# Patient Record
Sex: Male | Born: 1960 | Race: White | Smoking: Former smoker
Health system: Southern US, Community
[De-identification: ages and names within clinical notes are randomized; demographics above are authoritative.]

## PROBLEM LIST (undated history)

## (undated) DIAGNOSIS — M51369 Other intervertebral disc degeneration, lumbar region without mention of lumbar back pain or lower extremity pain: Secondary | ICD-10-CM

## (undated) DIAGNOSIS — R569 Unspecified convulsions: Secondary | ICD-10-CM

## (undated) DIAGNOSIS — F32A Depression, unspecified: Secondary | ICD-10-CM

## (undated) DIAGNOSIS — M5416 Radiculopathy, lumbar region: Secondary | ICD-10-CM

## (undated) DIAGNOSIS — I208 Other forms of angina pectoris: Secondary | ICD-10-CM

## (undated) DIAGNOSIS — I6381 Other cerebral infarction due to occlusion or stenosis of small artery: Secondary | ICD-10-CM

## (undated) DIAGNOSIS — M419 Scoliosis, unspecified: Secondary | ICD-10-CM

## (undated) DIAGNOSIS — N4400 Torsion of testis, unspecified: Secondary | ICD-10-CM

## (undated) DIAGNOSIS — I10 Essential (primary) hypertension: Secondary | ICD-10-CM

## (undated) DIAGNOSIS — F1290 Cannabis use, unspecified, uncomplicated: Secondary | ICD-10-CM

## (undated) DIAGNOSIS — K5790 Diverticulosis of intestine, part unspecified, without perforation or abscess without bleeding: Secondary | ICD-10-CM

## (undated) DIAGNOSIS — N200 Calculus of kidney: Secondary | ICD-10-CM

## (undated) DIAGNOSIS — E785 Hyperlipidemia, unspecified: Secondary | ICD-10-CM

## (undated) DIAGNOSIS — I2089 Other forms of angina pectoris: Secondary | ICD-10-CM

## (undated) DIAGNOSIS — F419 Anxiety disorder, unspecified: Secondary | ICD-10-CM

## (undated) DIAGNOSIS — L301 Dyshidrosis [pompholyx]: Secondary | ICD-10-CM

## (undated) DIAGNOSIS — I739 Peripheral vascular disease, unspecified: Secondary | ICD-10-CM

## (undated) DIAGNOSIS — R0602 Shortness of breath: Secondary | ICD-10-CM

## (undated) DIAGNOSIS — M5136 Other intervertebral disc degeneration, lumbar region: Secondary | ICD-10-CM

## (undated) DIAGNOSIS — Z7901 Long term (current) use of anticoagulants: Secondary | ICD-10-CM

## (undated) HISTORY — PX: TESTICLE TORSION REDUCTION: SHX795

---

## 2019-08-02 ENCOUNTER — Other Ambulatory Visit: Payer: Self-pay

## 2019-08-02 DIAGNOSIS — Z20822 Contact with and (suspected) exposure to covid-19: Secondary | ICD-10-CM

## 2019-08-04 LAB — NOVEL CORONAVIRUS, NAA: SARS-CoV-2, NAA: NOT DETECTED

## 2020-05-01 ENCOUNTER — Other Ambulatory Visit: Payer: Self-pay | Admitting: Neurology

## 2020-05-01 DIAGNOSIS — R569 Unspecified convulsions: Secondary | ICD-10-CM

## 2020-05-18 ENCOUNTER — Other Ambulatory Visit: Payer: Self-pay

## 2020-05-18 ENCOUNTER — Ambulatory Visit
Admission: RE | Admit: 2020-05-18 | Discharge: 2020-05-18 | Disposition: A | Discharge status: Home / Self Care | Payer: BC Managed Care – PPO | Source: Ambulatory Visit | Attending: Neurology | Admitting: Neurology

## 2020-05-18 DIAGNOSIS — R569 Unspecified convulsions: Secondary | ICD-10-CM | POA: Insufficient documentation

## 2020-05-18 MED ORDER — GADOBUTROL 1 MMOL/ML IV SOLN
5.0000 mL | Freq: Once | INTRAVENOUS | Status: AC | PRN
  Administered 2020-05-18: 5 mL via INTRAVENOUS

## 2020-07-05 ENCOUNTER — Other Ambulatory Visit: Payer: Self-pay | Admitting: Neurology

## 2020-07-05 DIAGNOSIS — G9519 Other vascular myelopathies: Secondary | ICD-10-CM

## 2020-07-24 ENCOUNTER — Other Ambulatory Visit: Payer: Self-pay

## 2020-07-24 ENCOUNTER — Ambulatory Visit
Admission: RE | Admit: 2020-07-24 | Discharge: 2020-07-24 | Disposition: A | Discharge status: Home / Self Care | Payer: BC Managed Care – PPO | Source: Ambulatory Visit | Attending: Neurology | Admitting: Neurology

## 2020-07-24 DIAGNOSIS — G9519 Other vascular myelopathies: Secondary | ICD-10-CM | POA: Insufficient documentation

## 2020-11-19 ENCOUNTER — Other Ambulatory Visit: Payer: Self-pay | Admitting: Neurology

## 2020-11-19 DIAGNOSIS — R569 Unspecified convulsions: Secondary | ICD-10-CM

## 2020-11-19 DIAGNOSIS — W108XXD Fall (on) (from) other stairs and steps, subsequent encounter: Secondary | ICD-10-CM

## 2020-12-05 DIAGNOSIS — F17200 Nicotine dependence, unspecified, uncomplicated: Secondary | ICD-10-CM | POA: Diagnosis present

## 2020-12-05 DIAGNOSIS — G40909 Epilepsy, unspecified, not intractable, without status epilepticus: Secondary | ICD-10-CM

## 2020-12-10 ENCOUNTER — Telehealth: Payer: Self-pay | Admitting: *Deleted

## 2020-12-10 ENCOUNTER — Encounter: Payer: Self-pay | Admitting: *Deleted

## 2020-12-10 NOTE — Telephone Encounter (Signed)
Received referral for low dose lung cancer screening CT scan. Message left at phone number listed in EMR for patient to call me back to facilitate scheduling scan. Also sent Mychart message.

## 2020-12-11 ENCOUNTER — Ambulatory Visit: Admission: RE | Admit: 2020-12-11 | Payer: BC Managed Care – PPO | Source: Ambulatory Visit

## 2021-01-18 ENCOUNTER — Telehealth: Payer: Self-pay | Admitting: *Deleted

## 2021-01-18 NOTE — Telephone Encounter (Signed)
Received referral for low dose lung cancer screening CT scan. Message left at phone number listed in EMR for patient to call me back to facilitate scheduling scan.  

## 2021-01-24 ENCOUNTER — Encounter: Payer: Self-pay | Admitting: *Deleted

## 2021-02-18 DIAGNOSIS — M5416 Radiculopathy, lumbar region: Secondary | ICD-10-CM | POA: Insufficient documentation

## 2021-03-06 ENCOUNTER — Emergency Department: Payer: Medicaid Other

## 2021-03-06 ENCOUNTER — Inpatient Hospital Stay
Admission: EM | Admit: 2021-03-06 | Discharge: 2021-03-09 | DRG: 253 | Disposition: A | Discharge status: Home / Self Care | Payer: Medicaid Other | Attending: Internal Medicine | Admitting: Internal Medicine

## 2021-03-06 ENCOUNTER — Other Ambulatory Visit: Payer: Self-pay

## 2021-03-06 DIAGNOSIS — G40909 Epilepsy, unspecified, not intractable, without status epilepticus: Secondary | ICD-10-CM | POA: Diagnosis present

## 2021-03-06 DIAGNOSIS — I745 Embolism and thrombosis of iliac artery: Secondary | ICD-10-CM | POA: Diagnosis present

## 2021-03-06 DIAGNOSIS — M419 Scoliosis, unspecified: Secondary | ICD-10-CM | POA: Diagnosis present

## 2021-03-06 DIAGNOSIS — I70261 Atherosclerosis of native arteries of extremities with gangrene, right leg: Principal | ICD-10-CM | POA: Diagnosis present

## 2021-03-06 DIAGNOSIS — Z7901 Long term (current) use of anticoagulants: Secondary | ICD-10-CM

## 2021-03-06 DIAGNOSIS — I7410 Embolism and thrombosis of unspecified parts of aorta: Secondary | ICD-10-CM | POA: Diagnosis present

## 2021-03-06 DIAGNOSIS — Z8249 Family history of ischemic heart disease and other diseases of the circulatory system: Secondary | ICD-10-CM

## 2021-03-06 DIAGNOSIS — E872 Acidosis: Secondary | ICD-10-CM | POA: Diagnosis present

## 2021-03-06 DIAGNOSIS — E876 Hypokalemia: Secondary | ICD-10-CM

## 2021-03-06 DIAGNOSIS — F17200 Nicotine dependence, unspecified, uncomplicated: Secondary | ICD-10-CM | POA: Diagnosis present

## 2021-03-06 DIAGNOSIS — I10 Essential (primary) hypertension: Secondary | ICD-10-CM

## 2021-03-06 DIAGNOSIS — F17210 Nicotine dependence, cigarettes, uncomplicated: Secondary | ICD-10-CM | POA: Diagnosis present

## 2021-03-06 DIAGNOSIS — I513 Intracardiac thrombosis, not elsewhere classified: Secondary | ICD-10-CM | POA: Diagnosis present

## 2021-03-06 DIAGNOSIS — Z20822 Contact with and (suspected) exposure to covid-19: Secondary | ICD-10-CM | POA: Diagnosis present

## 2021-03-06 DIAGNOSIS — I708 Atherosclerosis of other arteries: Secondary | ICD-10-CM | POA: Diagnosis present

## 2021-03-06 DIAGNOSIS — R569 Unspecified convulsions: Secondary | ICD-10-CM

## 2021-03-06 DIAGNOSIS — Z79899 Other long term (current) drug therapy: Secondary | ICD-10-CM | POA: Diagnosis not present

## 2021-03-06 DIAGNOSIS — I96 Gangrene, not elsewhere classified: Secondary | ICD-10-CM | POA: Diagnosis present

## 2021-03-06 DIAGNOSIS — I70209 Unspecified atherosclerosis of native arteries of extremities, unspecified extremity: Secondary | ICD-10-CM

## 2021-03-06 HISTORY — DX: Unspecified convulsions: R56.9

## 2021-03-06 HISTORY — DX: Embolism and thrombosis of unspecified parts of aorta: I74.10

## 2021-03-06 HISTORY — DX: Scoliosis, unspecified: M41.9

## 2021-03-06 HISTORY — DX: Essential (primary) hypertension: I10

## 2021-03-06 LAB — CBC WITH DIFFERENTIAL/PLATELET
Abs Immature Granulocytes: 0.04 10*3/uL (ref 0.00–0.07)
Basophils Absolute: 0 10*3/uL (ref 0.0–0.1)
Basophils Relative: 0 %
Eosinophils Absolute: 0 10*3/uL (ref 0.0–0.5)
Eosinophils Relative: 0 %
HCT: 31.6 % — ABNORMAL LOW (ref 39.0–52.0)
Hemoglobin: 11 g/dL — ABNORMAL LOW (ref 13.0–17.0)
Immature Granulocytes: 1 %
Lymphocytes Relative: 26 %
Lymphs Abs: 2.2 10*3/uL (ref 0.7–4.0)
MCH: 34.7 pg — ABNORMAL HIGH (ref 26.0–34.0)
MCHC: 34.8 g/dL (ref 30.0–36.0)
MCV: 99.7 fL (ref 80.0–100.0)
Monocytes Absolute: 0.6 10*3/uL (ref 0.1–1.0)
Monocytes Relative: 7 %
Neutro Abs: 5.7 10*3/uL (ref 1.7–7.7)
Neutrophils Relative %: 66 %
Platelets: 272 10*3/uL (ref 150–400)
RBC: 3.17 MIL/uL — ABNORMAL LOW (ref 4.22–5.81)
RDW: 18.4 % — ABNORMAL HIGH (ref 11.5–15.5)
WBC: 8.6 10*3/uL (ref 4.0–10.5)
nRBC: 0 % (ref 0.0–0.2)

## 2021-03-06 LAB — COMPREHENSIVE METABOLIC PANEL
ALT: 10 U/L (ref 0–44)
AST: 27 U/L (ref 15–41)
Albumin: 3.3 g/dL — ABNORMAL LOW (ref 3.5–5.0)
Alkaline Phosphatase: 119 U/L (ref 38–126)
Anion gap: 11 (ref 5–15)
BUN: 6 mg/dL (ref 6–20)
CO2: 30 mmol/L (ref 22–32)
Calcium: 8.8 mg/dL — ABNORMAL LOW (ref 8.9–10.3)
Chloride: 94 mmol/L — ABNORMAL LOW (ref 98–111)
Creatinine, Ser: 0.76 mg/dL (ref 0.61–1.24)
GFR, Estimated: >60 mL/min (ref >60)
Glucose, Bld: 115 mg/dL — ABNORMAL HIGH (ref 70–99)
Potassium: 2.8 mmol/L — ABNORMAL LOW (ref 3.5–5.1)
Sodium: 135 mmol/L (ref 135–145)
Total Bilirubin: 0.8 mg/dL (ref 0.3–1.2)
Total Protein: 6.7 g/dL (ref 6.5–8.1)

## 2021-03-06 LAB — LACTIC ACID, PLASMA
Lactic Acid, Venous: 2.9 mmol/L (ref 0.5–1.9)
Lactic Acid, Venous: 1.8 mmol/L (ref 0.5–1.9)

## 2021-03-06 LAB — PROTIME-INR
INR: 0.9 (ref 0.8–1.2)
Prothrombin Time: 12.6 seconds (ref 11.4–15.2)

## 2021-03-06 MED ORDER — ONDANSETRON HCL 4 MG/2ML IJ SOLN: 4.0000 mg | Freq: Four times a day (QID) | INTRAMUSCULAR | Status: DC | PRN

## 2021-03-06 MED ORDER — ONDANSETRON HCL 4 MG PO TABS
4.0000 mg | ORAL_TABLET | Freq: Four times a day (QID) | ORAL | Status: DC | PRN
Start: 2021-03-06 — End: 2021-03-09

## 2021-03-06 MED ORDER — HEPARIN (PORCINE) 25000 UT/250ML-% IV SOLN
950.0000 [IU]/h | INTRAVENOUS | Status: DC
  Administered 2021-03-06: 800 [IU]/h via INTRAVENOUS
  Filled 2021-03-06 (×2): qty 250

## 2021-03-06 MED ORDER — HEPARIN BOLUS VIA INFUSION
3300.0000 [IU] | Freq: Once | INTRAVENOUS | Status: AC
  Administered 2021-03-06: 3300 [IU] via INTRAVENOUS
  Filled 2021-03-06: qty 3300

## 2021-03-06 MED ORDER — ACETAMINOPHEN 650 MG RE SUPP: 650.0000 mg | Freq: Four times a day (QID) | RECTAL | Status: DC | PRN

## 2021-03-06 MED ORDER — POTASSIUM CHLORIDE CRYS ER 20 MEQ PO TBCR
40.0000 meq | EXTENDED_RELEASE_TABLET | Freq: Two times a day (BID) | ORAL | Status: AC
  Administered 2021-03-06: 40 meq via ORAL
  Filled 2021-03-06 (×2): qty 2

## 2021-03-06 MED ORDER — IOHEXOL 350 MG/ML SOLN
125.0000 mL | Freq: Once | INTRAVENOUS | Status: DC | PRN
  Filled 2021-03-06: qty 125

## 2021-03-06 MED ORDER — POTASSIUM CHLORIDE 10 MEQ/100ML IV SOLN
10.0000 meq | Freq: Once | INTRAVENOUS | Status: AC
  Administered 2021-03-06: 10 meq via INTRAVENOUS
  Filled 2021-03-06: qty 100

## 2021-03-06 MED ORDER — SODIUM CHLORIDE 0.9 % IV BOLUS
1000.0000 mL | Freq: Once | INTRAVENOUS | Status: AC
  Administered 2021-03-06: 1000 mL via INTRAVENOUS

## 2021-03-06 MED ORDER — ATORVASTATIN CALCIUM 20 MG PO TABS
80.0000 mg | ORAL_TABLET | Freq: Every day | ORAL | Status: DC
  Administered 2021-03-08 – 2021-03-09 (×2): 80 mg via ORAL
  Filled 2021-03-06: qty 1
  Filled 2021-03-06: qty 4

## 2021-03-06 MED ORDER — NICOTINE 21 MG/24HR TD PT24
21.0000 mg | MEDICATED_PATCH | Freq: Every day | TRANSDERMAL | Status: DC
  Administered 2021-03-06 – 2021-03-09 (×4): 21 mg via TRANSDERMAL
  Filled 2021-03-06 (×4): qty 1

## 2021-03-06 MED ORDER — MORPHINE SULFATE (PF) 2 MG/ML IV SOLN
2.0000 mg | INTRAVENOUS | Status: DC | PRN
  Administered 2021-03-07 (×2): 2 mg via INTRAVENOUS
  Filled 2021-03-06 (×2): qty 1

## 2021-03-06 MED ORDER — LORAZEPAM 2 MG/ML IJ SOLN: 2.0000 mg | INTRAMUSCULAR | Status: DC | PRN

## 2021-03-06 MED ORDER — POTASSIUM CHLORIDE IN NACL 40-0.9 MEQ/L-% IV SOLN
INTRAVENOUS | Status: DC
  Filled 2021-03-06 (×2): qty 1000

## 2021-03-06 MED ORDER — HEPARIN SODIUM (PORCINE) 5000 UNIT/ML IJ SOLN: 4000.0000 [IU] | Freq: Once | INTRAMUSCULAR | Status: DC

## 2021-03-06 MED ORDER — ACETAMINOPHEN 325 MG PO TABS: 650.0000 mg | ORAL_TABLET | Freq: Four times a day (QID) | ORAL | Status: DC | PRN

## 2021-03-06 MED ORDER — IOHEXOL 350 MG/ML SOLN
100.0000 mL | Freq: Once | INTRAVENOUS | Status: AC | PRN
  Administered 2021-03-06: 100 mL via INTRAVENOUS
  Filled 2021-03-06: qty 100

## 2021-03-06 MED ORDER — HYDRALAZINE HCL 20 MG/ML IJ SOLN: 10.0000 mg | Freq: Four times a day (QID) | INTRAMUSCULAR | Status: DC | PRN

## 2021-03-06 NOTE — ED Provider Notes (Signed)
[facility] Emergency Department Provider Note  ____________________________________________  Time seen: Approximately 4:06 PM  I have reviewed the triage vital signs and the nursing notes.   HISTORY  Chief Complaint Foot Pain    HPI Albert [name] is a 60 y.o. male who presented to the emergency department concern for a "clot" in his foot as he had seen a black spot to the middle toe of the right foot.  This is occurring on the plantar aspect.  He states that he has intermittent pain in his bilateral lower extremity.  Sometimes his feet feel cold.  He states that when he walks any kind of distance he starts having cramping in his lower extremities.  Patient states that one of his neighbors who was a former paramedic looked at his foot and told that he had a clot so he came into the emergency department.  He denies any fevers or chills.  No chest pain, shortness of breath, abdominal pain, nausea vomiting, diarrhea or constipation.  No bleeding or clotting disorders.  Patient has no history of DVT or PE.  Patient is a heavy smoker with a 60-pack-year history.  Patient denies any headache, visual changes, chest pain, shortness of breath.         Past Medical History:  Diagnosis Date  . Hypertension   . Scoliosis   . Seizures [facility])     Patient Active Problem List   Diagnosis Date Noted  . 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 Hypertension   . Seizures ([name])   . Hypokalemia   . Nicotine dependence     History reviewed. No pertinent surgical history.  Prior to Admission medications   Not on File    Allergies Patient has no allergy information on record.  Family History  Problem Relation Age of Onset  . Hypertension Father     Social History Social History   Tobacco Use  . Smoking status: Current Every Day Smoker  . Smokeless tobacco: Never Used  Substance Use Topics  . Alcohol use: Yes    Comment: occ  . Drug use: Yes    Types:  Marijuana     Review of Systems  Constitutional: No fever/chills Eyes: No visual changes. No discharge ENT: No upper respiratory complaints. Cardiovascular: no chest pain. Respiratory: no cough. No SOB. Gastrointestinal: No abdominal pain.  No nausea, no vomiting.  No diarrhea.  No constipation. Musculoskeletal: Bilateral lower extremity intermittent pain with pain to the third digit of the right foot with an area of "black" clot to the third digit Skin: Negative for rash, abrasions, lacerations, ecchymosis. Neurological: Negative for headaches, focal weakness or numbness.  10 System ROS otherwise negative.  ____________________________________________   PHYSICAL EXAM:  VITAL SIGNS: ED Triage Vitals  Enc Vitals Group     BP 03/06/21 1345 (!) 142/98     Pulse Rate 03/06/21 1345 91     Resp 03/06/21 1345 18     Temp 03/06/21 1345 98.2 F (36.8 C)     Temp Source 03/06/21 1345 Oral     SpO2 03/06/21 1345 100 %     Weight --      Height --      Head Circumference --      Peak Flow --      Pain Score 03/06/21 1343 10     Pain Loc --      Pain Edu? --      Excl. in GC? --  Constitutional: Alert and oriented. Well appearing and in no acute distress. Eyes: Conjunctivae are normal. PERRL. EOMI. Head: Atraumatic. ENT:      Ears:       Nose: No congestion/rhinnorhea.      Mouth/Throat: Mucous membranes are moist.  Neck: No stridor.    Cardiovascular: Normal rate, regular rhythm. Normal S1 and S2.  Good peripheral circulation. Respiratory: Normal respiratory effort without tachypnea or retractions. Lungs CTAB. Good air entry to the bases with no decreased or absent breath sounds. Musculoskeletal: Full range of motion to all extremities. No gross deformities appreciated.  Visualization of bilateral lower extremities reveals paleness starting mid feet bilaterally.  No obvious deformity.  No gross erythema or edema.  Visualization of the right foot reveals an area of  necrosis to the plantar aspect of the third digit.  This toe is still tender to palpation the capillary refill is greater than 4 seconds.  Capillary refill to all digits of both left and right feet are greater than 4 seconds.  Palpation reveals palpable dorsalis pedis pulse to the left foot but I am unable to palpate right side.  Patient's right foot however is warm to the touch where the left foot is cool to the touch.  No edema of the lower extremities identified.  Patient has diffuse tenderness through the musculature bilaterally. Neurologic:  Normal speech and language. No gross focal neurologic deficits are appreciated.  Skin:  Skin is warm, dry and intact. No rash noted. Psychiatric: Mood and affect are normal. Speech and behavior are normal. Patient exhibits appropriate insight and judgement.   ____________________________________________   LABS (all labs ordered are listed, but only abnormal results are displayed)  Labs Reviewed  CBC WITH DIFFERENTIAL/PLATELET - Abnormal; Notable for the following components:      Result Value   RBC 3.17 (*)    Hemoglobin 11.0 (*)    HCT 31.6 (*)    MCH 34.7 (*)    RDW 18.4 (*)    All other components within normal limits  COMPREHENSIVE METABOLIC PANEL - Abnormal; Notable for the following components:   Potassium 2.8 (*)    Chloride 94 (*)    Glucose, Bld 115 (*)    Calcium 8.8 (*)    Albumin 3.3 (*)    All other components within normal limits  LACTIC ACID, PLASMA - Abnormal; Notable for the following components:   Lactic Acid, Venous 2.9 (*)    All other components within normal limits  CULTURE, BLOOD (ROUTINE X 2)  CULTURE, BLOOD (ROUTINE X 2)  SARS CORONAVIRUS 2 (TAT 6-24 HRS)  PROTIME-INR  LACTIC ACID, PLASMA  HEPARIN LEVEL (UNFRACTIONATED)  CBC  BASIC METABOLIC PANEL  HIV ANTIBODY (ROUTINE TESTING W REFLEX)  MAGNESIUM    ____________________________________________  EKG   ____________________________________________  RADIOLOGY I personally viewed and evaluated these images as part of my medical decision making, as well as reviewing the written report by the radiologist.  ED Provider Interpretation: I discussed the results with the radiologist.  Patient has significant clot burden starting at the infrarenal aorta throughout the bilateral lower extremities.  Multiple areas of occlusion identified.  CT Angio Aortobifemoral W and/or Wo Contrast  Result Date: 03/06/2021 CLINICAL DATA:  Discoloration of the right fourth toe with right foot pain EXAM: CT ANGIOGRAPHY OF ABDOMINAL AORTA WITH ILIOFEMORAL RUNOFF TECHNIQUE: Multidetector CT imaging of the abdomen, pelvis and lower extremities was performed using the standard protocol during bolus administration of intravenous contrast. Multiplanar CT image reconstructions and MIPs  were obtained to evaluate the vascular anatomy. CONTRAST:  OMNIPAQUE IOHEXOL 350 MG/ML SOLN COMPARISON:  None. FINDINGS: VASCULAR Aorta: Atherosclerotic calcifications are noted with considerable mural thrombus within the infrarenal aorta. Celiac: Patent without evidence of aneurysm, dissection, vasculitis or significant stenosis. SMA: Patent without evidence of aneurysm, dissection, vasculitis or significant stenosis. Renals: Both renal arteries are patent without evidence of aneurysm, dissection, vasculitis, fibromuscular dysplasia or significant stenosis. IMA: Patent without evidence of aneurysm, dissection, vasculitis or significant stenosis. RIGHT Lower Extremity Inflow: Atherosclerotic calcifications are noted within the common and external iliac arteries on the right. Irregular filling defect is noted at the level of the iliac bifurcation with occlusion of the right internal iliac artery at its origin with reconstitution via multiple muscular collaterals in the deeper pelvis. Additionally  there is high-grade narrowing of the distal aspect of the common iliac artery extending into the external iliac artery. The external iliac artery is within normal limits. Runoff: Superficial femoral artery is widely patent. Atherosclerotic changes in the proximal and mid popliteal artery are noted. Popliteal trifurcation is patent with 2 vessel runoff to the level of the right foot. Digital arteries are noted extending into the distal aspect of the foot without occlusive change. LEFT Lower Extremity Inflow: There is occlusion of the left common iliac artery at its origin related to the mural thrombus in the distal aorta. Reconstitution of the proximal aspect left internal iliac artery is noted although multiple filling defects are noted within consistent with thrombi/emboli. Reconstitution of the common femoral artery is noted primarily via the inferior epigastric artery on the left as well as multiple muscular collaterals to include the deep circumflex iliac artery on the left. Runoff: Common femoral bifurcation is patent although a tubular filling defect is noted in the proximal aspect of the left superficial femoral artery consistent with thrombus. This is attached at the level of the common femoral artery. Distal superficial femoral artery is within normal limits. Popliteal artery is patent as well. The popliteal trifurcation however shows only patency of the anterior tibial artery proximally. Some segmental reconstitution of the peroneal artery is seen. Mid calf occlusion of the anterior tibial artery is noted with some distal reconstitution via muscular collaterals. Mild reconstitution of the distal aspect of the posterior tibial artery is noted as well. Evaluation of the foot is limited due to the multiple areas of occlusion. Veins: No specific venous abnormality is noted. Review of the MIP images confirms the above findings. NON-VASCULAR Lower chest: No acute abnormality. Hepatobiliary: Mild dependent  gallstones are seen without complicating factors. The liver is unremarkable. Pancreas: Unremarkable. No pancreatic ductal dilatation or surrounding inflammatory changes. Spleen: Normal in size without focal abnormality. Adrenals/Urinary Tract: Adrenal glands are mildly hypertrophied without focal mass. Kidneys demonstrate scattered hypodensities likely related to renal cyst but incompletely evaluated this exam. 7 mm nonobstructing stone is noted in the midportion of the left kidney. The ureters are within normal limits. Bladder is partially distended. Stomach/Bowel: Scattered diverticular change of the colon is noted without evidence of diverticulitis. The appendix is within normal limits. Small bowel and stomach are unremarkable. Lymphatic: No significant lymphadenopathy is noted. Reproductive: Prostate is within normal limits. Other: No abdominal wall hernia or abnormality. No abdominopelvic ascites. Musculoskeletal: No acute bony abnormality is noted. 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 Irregular filling defect within the distal common iliac artery on the right extending into the proximal  aspect of the external iliac artery on the right with high-grade stenosis identified. Occlusion of the right internal iliac artery at its origin is noted secondary to this filling defect. This is highly suspicious for underlying embolus. Runoff on the right shows atherosclerotic disease although 2 vessel runoff to the level of the right foot and subsequently into the foot is identified. The left common iliac artery occlusion extends to the level of the common femoral artery with significant reconstitution via the deep circumflex iliac artery and inferior epigastric arteries on the left. Some reconstitution of the distal aspect of the left internal iliac artery is noted although multifocal filling defects and  occlusive changes are seen again highly suspicious for emboli. Runoff on the left shows a tubular filling defect within the proximal superficial femoral artery which appears attached to the arterial wall in the distal common iliac artery. More distal runoff shows occlusion of the tibioperoneal trunk with minimal reconstitution of the peroneal and posterior tibial arteries in the distal left leg. The anterior tibial artery is patent proximally but demonstrates short segment occlusion in the mid to distal calf with reconstitution via muscular collaterals. More distal flow into the foot is not well visualized in part due to the multifocal occlusions. Correlation with Doppler pulses is recommended. NON-VASCULAR Cholelithiasis without complicating factors. Nonobstructing left renal stone as described. Diverticulosis without diverticulitis. Cystic changes in the kidneys bilaterally. These are stable from a prior MRI from October of 2021. Critical Value/emergent results were called by telephone at the time of interpretation on 03/06/2021 at 7:10 pm to [facility] , who verbally acknowledged these results. Electronically Signed   By: Alcide Clever M.D.   On: 03/06/2021 19:09   DG Foot Complete Right  Result Date: 03/06/2021 CLINICAL DATA:  Toe necrosis, foot pain EXAM: RIGHT FOOT COMPLETE - 3+ VIEW COMPARISON:  None. FINDINGS: Degenerative changes of the 1st MTP joint with joint space narrowing and spurring. No acute bony abnormality. Specifically, no fracture, subluxation, or dislocation. Plantar calcaneal spur. IMPRESSION: No acute bony abnormality. Electronically Signed   By: Charlett Nose M.D.   On: 03/06/2021 17:21    ____________________________________________    PROCEDURES  Procedure(s) performed:    Procedures    Medications  heparin ADULT infusion 100 units/mL (25000 units/219mL) (800 Units/hr Intravenous New Bag/Given 03/06/21 2003)  potassium chloride SA (KLOR-CON) CR tablet 40 mEq (40  mEq Oral Given 03/06/21 2234)  0.9 % NaCl with KCl 40 mEq / L  infusion ( Intravenous New Bag/Given 03/06/21 2222)  acetaminophen (TYLENOL) tablet 650 mg (has no administration in time range)    Or  acetaminophen (TYLENOL) suppository 650 mg (has no administration in time range)  morphine 2 MG/ML injection 2 mg (has no administration in time range)  ondansetron (ZOFRAN) tablet 4 mg (has no administration in time range)    Or  ondansetron (ZOFRAN) injection 4 mg (has no administration in time range)  nicotine (NICODERM CQ - dosed in mg/24 hours) patch 21 mg (21 mg Transdermal Patch Applied 03/06/21 2235)  atorvastatin (LIPITOR) tablet 80 mg (has no administration in time range)  hydrALAZINE (APRESOLINE) injection 10 mg (has no administration in time range)  LORazepam (ATIVAN) injection 2 mg (has no administration in time range)  iohexol (OMNIPAQUE) 350 MG/ML injection 100 mL (100 mLs Intravenous Contrast Given 03/06/21 1817)  sodium chloride 0.9 % bolus 1,000 mL (0 mLs Intravenous Stopped 03/06/21 2238)  potassium chloride 10 mEq in 100 mL IVPB (0 mEq Intravenous Stopped 03/06/21  2151)  heparin bolus via infusion 3,300 Units (3,300 Units Intravenous Bolus from Bag 03/06/21 2003)     ____________________________________________   INITIAL IMPRESSION / ASSESSMENT AND PLAN / ED COURSE  Pertinent labs & imaging results that were available during my care of the patient were reviewed by me and considered in my medical decision making (see chart for details).  Review of the  [place] was performed in accordance of the [facility] prior to dispensing any controlled drugs.           Patient's diagnosis is consistent with arterial occlusion, aortic thrombus, hypokalemia.  Patient presented to the emergency department with intermittent bilateral lower extremity pain with ambulation or activity.  Patient noted a black spot that it developed on the third digit of his right foot and had a friend that was a paramedic  evaluated.  The patient states that the paramedic told him that he likely had a blood clot in his toe.  When evaluation it appeared that patient had necrosis of the digit.  I was able to palpate a dorsalis pedis pulse to the left foot but the left foot was cool to the touch with paleness extending from the foot into the toes.  Delayed capillary refill.  Right foot was warm but I was unable to palpate a pulse at this time.  Patient had delayed capillary refill to this foot as well.  Again patient had signs of necrosis to the plantar aspect of the third digit right foot.  Given these concerning signs and symptoms I evaluated the patient with labs and imaging.  With the contrast shortage I still felt this was an appropriate patient for IV contrast for concern for ischemic extremity and it was determined based off imaging that the patient had aortic thrombus from the infrarenal region to the bifurcation.  Patient had multiple levels of occlusion and embolism seen to the lower extremities bilaterally.  This is documented in detail in the radiology results.  At this time patient was given a bolus of heparin, started on heparin drip and admitted to the hospitalist service.  I discussed the patient with vascular surgery who recommends that they will follow the patient while admitted and they will likely perform angiography either tomorrow or the next day.  Continue heparin at this time.  Patient will be transferred to the hospitalist service for further management at this time.   ____________________________________________  FINAL CLINICAL IMPRESSION(S) / ED DIAGNOSES  Final diagnoses:  Arterial occlusion, lower extremity ([name])  Aortic thrombus ([name])  Hypokalemia      NEW MEDICATIONS STARTED DURING THIS VISIT:  ED Discharge Orders    None          This chart was dictated using voice recognition software/Dragon. Despite best efforts to proofread, errors can occur which can change the meaning. Any  change was purely unintentional.    Racheal PatchesCuthriell, Zylen Wenig D, PA-C 03/07/21 0109    Delton PrairieSmith, Dylan, MD 03/07/21 [phone]

## 2021-03-06 NOTE — ED Notes (Signed)
Dr Agbata at bedside. 

## 2021-03-06 NOTE — H&P (Addendum)
History and Physical    Albert [name] PXT:[id number] DOB: [birth year] DOA: 03/06/2021  PCP: [facility]   Patient coming from: Home  I have personally briefly reviewed patient's old medical records in [facility]  Chief Complaint: Discoloration of fourth and fifth toe on the right foot.  HPI: Albert [name] is a 60 y.o. male with medical history significant for seizure disorder, hypertension and nicotine dependence who presents to the ER for evaluation of concerns for possible clot in his right foot.  Patient states he noted a black discoloration over the fourth toe on his right foot as well as some discoloration of the fifth toe.  He had discussed this with his neighbor who is a paramedic and who advised him to go get checked for a possible blood clot. He complains of diarrhea but denies having any abdominal pain. He denies having any fever or chills, no chest pain, no shortness of breath, no abdominal pain, no nausea, no vomiting, no hematochezia, no melena, no hematemesis, no dizziness, no lightheadedness, no headache, no blurred vision, no focal deficits. Labs show sodium 135, potassium 2.8, chloride 94, bicarb 30, glucose 115, BUN 6, creatinine 0.76, calcium 8.8, alkaline phosphatase 119, albumin 3.3, AST 27, ALT 10, total protein 6.7, lactic acid 2.9, white count 8.6, hemoglobin 11.0, hematocrit 31.6, MCV 99.7, RDW 18.4, platelet count 272.  PT 12.6, INR 0.9 Right foot x-ray shows no acute bony abnormality CT angiogram of abdominal aorta with iliofemoral runoff shows  Significant mural thrombus within the infrarenal abdominal aorta with subsequent occlusion of the left common iliac artery at its origin. Irregular filling defect within the distal common iliac artery on the right extending into the proximal aspect of the external iliac artery on the right with high-grade stenosis identified. Occlusion of the right internal iliac artery at its origin is noted secondary to  this filling defect. This is highly suspicious for underlying embolus. Runoff on the right shows atherosclerotic disease although 2 vessel runoff to the level of the right foot and subsequently into the foot is identified. The left common iliac artery occlusion extends to the level of the common femoral artery with significant reconstitution via the deep circumflex iliac artery and inferior epigastric arteries on the left. Some reconstitution of the distal aspect of the left internal iliac artery is noted although multifocal filling defects and occlusive changes are seen again highly suspicious for emboli. Runoff on the left shows a tubular filling defect within the proximal superficial femoral artery which appears attached to the arterial wall in the distal common iliac artery. More distal runoff shows occlusion of the tibioperoneal trunk with minimal reconstitution of the peroneal and posterior tibial arteries in the distal left leg. The anterior tibial artery is patent proximally but demonstrates short segment occlusion in the mid to distal calf with reconstitution via muscular collaterals. More distal flow into the foot is not well visualized in part due to the multifocal occlusions. Correlation with Doppler pulses is recommended.    ED Course: Patient is a 60 year old male who presents to the ER for evaluation of gangrene  involving the fourth toe on his right foot as well as the right fifth toe.  He has a significant history of nicotine use.  Imaging shows extensive thrombus from the infrarenal abdominal aorta extending into the lower extremities. Patient has been started on heparin drip in the ER and will be admitted to the hospital for further evaluation.      Review of Systems: As  per HPI otherwise all other systems reviewed and negative.    Past Medical History:  Diagnosis Date  . Hypertension   . Scoliosis   . Seizures ([name])     History reviewed. No pertinent surgical  history.   reports that he has been smoking. He has never used smokeless tobacco. He reports current alcohol use. He reports current drug use. Drug: Marijuana.  Not on File  Family History  Problem Relation Age of Onset  . Hypertension Father       Prior to Admission medications   Not on File    Physical Exam: Vitals:   03/06/21 1345 03/06/21 1932 03/06/21 2000  BP: (!) 142/98  (!) 165/76  Pulse: 91  67  Resp: 18  14  Temp: 98.2 F (36.8 C)    TempSrc: Oral    SpO2: 100%  99%  Weight:  47.6 kg   Height:  5\' 7"  (1.702 m)      Vitals:   03/06/21 1345 03/06/21 1932 03/06/21 2000  BP: (!) 142/98  (!) 165/76  Pulse: 91  67  Resp: 18  14  Temp: 98.2 F (36.8 C)    TempSrc: Oral    SpO2: 100%  99%  Weight:  47.6 kg   Height:  5\' 7"  (1.702 m)       Constitutional: Alert and oriented x 3 . Not in any apparent distress.  Chronically ill-appearing and thin HEENT:      Head: Normocephalic and atraumatic.         Eyes: PERLA, EOMI, Conjunctivae are normal. Sclera is non-icteric.       Mouth/Throat: Mucous membranes are moist.       Neck: Supple with no signs of meningismus. Cardiovascular: Regular rate and rhythm. No murmurs, gallops, or rubs. 2+ symmetrical distal pulses are present . No JVD. No  LE edema Respiratory: Respiratory effort normal .Lungs sounds clear bilaterally. No wheezes, crackles, or rhonchi.  Gastrointestinal: Soft, non tender, and non distended with positive bowel sounds.  Genitourinary: No CVA tenderness. Musculoskeletal: Nontender with normal range of motion in all extremities. No cyanosis, or erythema of extremities. Neurologic:  Face is symmetric. Moving all extremities. No gross focal neurologic deficits  Skin: Skin is warm, dry.  Gangrene involving the fourth toe on the right foot.  Reddish discoloration involving the right fifth toe.  No rash or ulcers Left foot is cool to touch while the right foot is warm Psychiatric: Mood and affect are  normal   Labs on Admission: I have personally reviewed following labs and imaging studies  CBC: Recent Labs  Lab 03/06/21 1619  WBC 8.6  NEUTROABS 5.7  HGB 11.0*  HCT 31.6*  MCV 99.7  PLT 272   Basic Metabolic Panel: Recent Labs  Lab 03/06/21 1619  NA 135  K 2.8*  CL 94*  CO2 30  GLUCOSE 115*  BUN 6  CREATININE 0.76  CALCIUM 8.8*   GFR: Estimated Creatinine Clearance: 67.8 mL/min (by C-G formula based on SCr of 0.76 mg/dL). Liver Function Tests: Recent Labs  Lab 03/06/21 1619  AST 27  ALT 10  ALKPHOS 119  BILITOT 0.8  PROT 6.7  ALBUMIN 3.3*   No results for input(s): LIPASE, AMYLASE in the last 168 hours. No results for input(s): AMMONIA in the last 168 hours. Coagulation Profile: Recent Labs  Lab 03/06/21 1619  INR 0.9   Cardiac Enzymes: No results for input(s): CKTOTAL, CKMB, CKMBINDEX, TROPONINI in the last 168 hours. BNP (last 3  results) No results for input(s): PROBNP in the last 8760 hours. HbA1C: No results for input(s): HGBA1C in the last 72 hours. CBG: No results for input(s): GLUCAP in the last 168 hours. Lipid Profile: No results for input(s): CHOL, HDL, LDLCALC, TRIG, CHOLHDL, LDLDIRECT in the last 72 hours. Thyroid Function Tests: No results for input(s): TSH, T4TOTAL, FREET4, T3FREE, THYROIDAB in the last 72 hours. Anemia Panel: No results for input(s): VITAMINB12, FOLATE, FERRITIN, TIBC, IRON, RETICCTPCT in the last 72 hours. Urine analysis: No results found for: COLORURINE, APPEARANCEUR, LABSPEC, PHURINE, GLUCOSEU, HGBUR, BILIRUBINUR, KETONESUR, PROTEINUR, UROBILINOGEN, NITRITE, LEUKOCYTESUR  Radiological Exams on Admission: CT Angio Aortobifemoral W and/or Wo Contrast  Result Date: 03/06/2021 CLINICAL DATA:  Discoloration of the right fourth toe with right foot pain EXAM: CT ANGIOGRAPHY OF ABDOMINAL AORTA WITH ILIOFEMORAL RUNOFF TECHNIQUE: Multidetector CT imaging of the abdomen, pelvis and lower extremities was performed using the  standard protocol during bolus administration of intravenous contrast. Multiplanar CT image reconstructions and MIPs were obtained to evaluate the vascular anatomy. CONTRAST:  OMNIPAQUE IOHEXOL 350 MG/ML SOLN COMPARISON:  None. FINDINGS: VASCULAR Aorta: Atherosclerotic calcifications are noted with considerable mural thrombus within the infrarenal aorta. Celiac: Patent without evidence of aneurysm, dissection, vasculitis or significant stenosis. SMA: Patent without evidence of aneurysm, dissection, vasculitis or significant stenosis. Renals: Both renal arteries are patent without evidence of aneurysm, dissection, vasculitis, fibromuscular dysplasia or significant stenosis. IMA: Patent without evidence of aneurysm, dissection, vasculitis or significant stenosis. RIGHT Lower Extremity Inflow: Atherosclerotic calcifications are noted within the common and external iliac arteries on the right. Irregular filling defect is noted at the level of the iliac bifurcation with occlusion of the right internal iliac artery at its origin with reconstitution via multiple muscular collaterals in the deeper pelvis. Additionally there is high-grade narrowing of the distal aspect of the common iliac artery extending into the external iliac artery. The external iliac artery is within normal limits. Runoff: Superficial femoral artery is widely patent. Atherosclerotic changes in the proximal and mid popliteal artery are noted. Popliteal trifurcation is patent with 2 vessel runoff to the level of the right foot. Digital arteries are noted extending into the distal aspect of the foot without occlusive change. LEFT Lower Extremity Inflow: There is occlusion of the left common iliac artery at its origin related to the mural thrombus in the distal aorta. Reconstitution of the proximal aspect left internal iliac artery is noted although multiple filling defects are noted within consistent with thrombi/emboli. Reconstitution of the common  femoral artery is noted primarily via the inferior epigastric artery on the left as well as multiple muscular collaterals to include the deep circumflex iliac artery on the left. Runoff: Common femoral bifurcation is patent although a tubular filling defect is noted in the proximal aspect of the left superficial femoral artery consistent with thrombus. This is attached at the level of the common femoral artery. Distal superficial femoral artery is within normal limits. Popliteal artery is patent as well. The popliteal trifurcation however shows only patency of the anterior tibial artery proximally. Some segmental reconstitution of the peroneal artery is seen. Mid calf occlusion of the anterior tibial artery is noted with some distal reconstitution via muscular collaterals. Mild reconstitution of the distal aspect of the posterior tibial artery is noted as well. Evaluation of the foot is limited due to the multiple areas of occlusion. Veins: No specific venous abnormality is noted. Review of the MIP images confirms the above findings. NON-VASCULAR Lower chest: No acute abnormality.  Hepatobiliary: Mild dependent gallstones are seen without complicating factors. The liver is unremarkable. Pancreas: Unremarkable. No pancreatic ductal dilatation or surrounding inflammatory changes. Spleen: Normal in size without focal abnormality. Adrenals/Urinary Tract: Adrenal glands are mildly hypertrophied without focal mass. Kidneys demonstrate scattered hypodensities likely related to renal cyst but incompletely evaluated this exam. 7 mm nonobstructing stone is noted in the midportion of the left kidney. The ureters are within normal limits. Bladder is partially distended. Stomach/Bowel: Scattered diverticular change of the colon is noted without evidence of diverticulitis. The appendix is within normal limits. Small bowel and stomach are unremarkable. Lymphatic: No significant lymphadenopathy is noted. Reproductive: Prostate is  within normal limits. Other: No abdominal wall hernia or abnormality. No abdominopelvic ascites. Musculoskeletal: No acute bony abnormality is noted. 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 Irregular filling defect within the distal common iliac artery on the right extending into the proximal aspect of the external iliac artery on the right with high-grade stenosis identified. Occlusion of the right internal iliac artery at its origin is noted secondary to this filling defect. This is highly suspicious for underlying embolus. Runoff on the right shows atherosclerotic disease although 2 vessel runoff to the level of the right foot and subsequently into the foot is identified. The left common iliac artery occlusion extends to the level of the common femoral artery with significant reconstitution via the deep circumflex iliac artery and inferior epigastric arteries on the left. Some reconstitution of the distal aspect of the left internal iliac artery is noted although multifocal filling defects and occlusive changes are seen again highly suspicious for emboli. Runoff on the left shows a tubular filling defect within the proximal superficial femoral artery which appears attached to the arterial wall in the distal common iliac artery. More distal runoff shows occlusion of the tibioperoneal trunk with minimal reconstitution of the peroneal and posterior tibial arteries in the distal left leg. The anterior tibial artery is patent proximally but demonstrates short segment occlusion in the mid to distal calf with reconstitution via muscular collaterals. More distal flow into the foot is not well visualized in part due to the multifocal occlusions. Correlation with Doppler pulses is recommended. NON-VASCULAR Cholelithiasis without complicating factors. Nonobstructing left renal stone as described.  Diverticulosis without diverticulitis. Cystic changes in the kidneys bilaterally. These are stable from a prior MRI from October of 2021. Critical Value/emergent results were called by telephone at the time of interpretation on 03/06/2021 at 7:10 pm to [facility] , who verbally acknowledged these results. Electronically Signed   By: Alcide Clever M.D.   On: 03/06/2021 19:09   DG Foot Complete Right  Result Date: 03/06/2021 CLINICAL DATA:  Toe necrosis, foot pain EXAM: RIGHT FOOT COMPLETE - 3+ VIEW COMPARISON:  None. FINDINGS: Degenerative changes of the 1st MTP joint with joint space narrowing and spurring. No acute bony abnormality. Specifically, no fracture, subluxation, or dislocation. Plantar calcaneal spur. IMPRESSION: No acute bony abnormality. Electronically Signed   By: Charlett Nose M.D.   On: 03/06/2021 17:21     Assessment/Plan Principal Problem:   Aortic mural thrombus ([name]) Active Problems:   Hypertension   Seizures ([name])   Hypokalemia   Nicotine dependence     Aortic mural thrombus/peripheral arterial disease Continue heparin drip initiated in the ER Will request vascular surgery consult Start patient on high intensity statin Smoking cessation discussed with patient in detail  History of seizure disorder We will place patient on Ativan 2 mg as needed for seizures until med rec becomes available so I can resume his antiepileptic drugs    Hypokalemia Unclear etiology Supplement potassium Obtain magnesium levels    Nicotine dependence Smoking cessation has been discussed with patient in detail We will place patient on a nicotine transdermal patch 21 mg daily    Hypertension We will place patient on as needed hydralazine until med rec is completed    Lactic acidosis Unclear etiology No evidence of sepsis at this time and patient denies having abdominal pain We will cycle lactic acid levels    DVT prophylaxis: Heparin Code Status: full  code Family Communication: Greater than 50% of time was spent discussing patient's condition and plan of care with him at the bedside.  All questions and concerns have been addressed.  He verbalizes understanding and agrees with the plan.  CODE STATUS was discussed and he is a full code. Disposition Plan: Back to previous home environment Consults called: Vascular surgery Status: At the time of admission, it appears that the appropriate admission status for this patient is inpatient. This is judged to be reasonable and necessary in order to provide the required intensity of service to ensure the patient's safety given the presenting symptoms, physical exam findings and initial radiographic and laboratory data in the context of the comorbid conditions. Patient requires inpatient status due to high intensity of service, high risk for further deterioration and high frequency of surveillance required.    Lucile Shuttersochukwu Naylee Frankowski MD [facility]     03/06/2021, 9:55 PM

## 2021-03-06 NOTE — Progress Notes (Signed)
ANTICOAGULATION CONSULT NOTE  Pharmacy Consult for heparin infusion Indication: arterial occlusion  Not on File  Patient Measurements: Height: 5\' 7"  (170.2 cm) Weight: 47.6 kg (105 lb) IBW/kg (Calculated) : 66.1 Heparin Dosing Weight: 47.6 kg  Vital Signs: Temp: 98.2 F (36.8 C) (06/01 1345) Temp Source: Oral (06/01 1345) BP: 142/98 (06/01 1345) Pulse Rate: 91 (06/01 1345)  Labs: Recent Labs    03/06/21 1619  HGB 11.0*  HCT 31.6*  PLT 272  LABPROT 12.6  INR 0.9  CREATININE 0.76    Estimated Creatinine Clearance: 67.8 mL/min (by C-G formula based on SCr of 0.76 mg/dL).   Medical History: Past Medical History:  Diagnosis Date  . Hypertension   . Scoliosis   . Seizures ([name])     Medications:  Per chart review and spoke with nurse who verified with pt that he does not take any anticoagulation at home.   Assessment: 60 y.o. male who presented to the emergency department concern for a "clot" in his foot as he had seen a black spot to the middle toe of the right foot. Pt has no history of DVT or PE. Pharmacy has been consulted for heparin dosing and monitoring for arterial occlusion.   Baseline labs: PT 12.6, INR 0.9, Hgb 11.0, Hct 31.6, Plt 272  Goal of Therapy:  Heparin level 0.3-0.7 units/ml Monitor platelets by anticoagulation protocol: Yes   Plan:   Give 3300 units bolus x 1  Start heparin infusion at 800 units/hr  Check anti-Xa level in 6 hours and daily while on heparin  Continue to monitor H&H and platelets  41, PharmD Pharmacy Resident  03/06/2021 7:41 PM

## 2021-03-06 NOTE — ED Triage Notes (Signed)
Pt come with c/o right foot pain that started yesterday. Pt states he thinks their might be a blood clot in there. Pt states pain and swelling. Pt states more pain when walking.

## 2021-03-07 ENCOUNTER — Other Ambulatory Visit (INDEPENDENT_AMBULATORY_CARE_PROVIDER_SITE_OTHER): Payer: Self-pay | Admitting: Vascular Surgery

## 2021-03-07 DIAGNOSIS — I96 Gangrene, not elsewhere classified: Secondary | ICD-10-CM

## 2021-03-07 DIAGNOSIS — I7410 Embolism and thrombosis of unspecified parts of aorta: Secondary | ICD-10-CM

## 2021-03-07 DIAGNOSIS — E876 Hypokalemia: Secondary | ICD-10-CM

## 2021-03-07 DIAGNOSIS — F17200 Nicotine dependence, unspecified, uncomplicated: Secondary | ICD-10-CM

## 2021-03-07 DIAGNOSIS — I70209 Unspecified atherosclerosis of native arteries of extremities, unspecified extremity: Secondary | ICD-10-CM

## 2021-03-07 LAB — BASIC METABOLIC PANEL
Anion gap: 9 (ref 5–15)
BUN: <5 mg/dL — ABNORMAL LOW (ref 6–20)
CO2: 26 mmol/L (ref 22–32)
Calcium: 7.6 mg/dL — ABNORMAL LOW (ref 8.9–10.3)
Chloride: 103 mmol/L (ref 98–111)
Creatinine, Ser: 0.67 mg/dL (ref 0.61–1.24)
GFR, Estimated: >60 mL/min (ref >60)
Glucose, Bld: 90 mg/dL (ref 70–99)
Potassium: 3.2 mmol/L — ABNORMAL LOW (ref 3.5–5.1)
Sodium: 138 mmol/L (ref 135–145)

## 2021-03-07 LAB — HEPARIN LEVEL (UNFRACTIONATED)
Heparin Unfractionated: 0.15 IU/mL — ABNORMAL LOW (ref 0.30–0.70)
Heparin Unfractionated: 0.5 IU/mL (ref 0.30–0.70)
Heparin Unfractionated: 0.51 IU/mL (ref 0.30–0.70)

## 2021-03-07 LAB — MAGNESIUM: Magnesium: 1.5 mg/dL — ABNORMAL LOW (ref 1.7–2.4)

## 2021-03-07 LAB — CBC
HCT: 26.1 % — ABNORMAL LOW (ref 39.0–52.0)
Hemoglobin: 8.9 g/dL — ABNORMAL LOW (ref 13.0–17.0)
MCH: 34.6 pg — ABNORMAL HIGH (ref 26.0–34.0)
MCHC: 34.1 g/dL (ref 30.0–36.0)
MCV: 101.6 fL — ABNORMAL HIGH (ref 80.0–100.0)
Platelets: 217 10*3/uL (ref 150–400)
RBC: 2.57 MIL/uL — ABNORMAL LOW (ref 4.22–5.81)
RDW: 18.1 % — ABNORMAL HIGH (ref 11.5–15.5)
WBC: 6.2 10*3/uL (ref 4.0–10.5)
nRBC: 0 % (ref 0.0–0.2)

## 2021-03-07 LAB — SARS CORONAVIRUS 2 (TAT 6-24 HRS): SARS Coronavirus 2: NEGATIVE

## 2021-03-07 LAB — HIV ANTIBODY (ROUTINE TESTING W REFLEX): HIV Screen 4th Generation wRfx: NONREACTIVE

## 2021-03-07 MED ORDER — LEVETIRACETAM 750 MG PO TABS
750.0000 mg | ORAL_TABLET | Freq: Two times a day (BID) | ORAL | Status: DC
  Administered 2021-03-07 – 2021-03-09 (×4): 750 mg via ORAL
  Filled 2021-03-07 (×8): qty 1

## 2021-03-07 MED ORDER — AMLODIPINE BESYLATE 5 MG PO TABS
5.0000 mg | ORAL_TABLET | Freq: Every day | ORAL | Status: DC
  Administered 2021-03-07 – 2021-03-09 (×3): 5 mg via ORAL
  Filled 2021-03-07 (×3): qty 1

## 2021-03-07 MED ORDER — SODIUM CHLORIDE 0.9 % IV SOLN: INTRAVENOUS | Status: DC

## 2021-03-07 MED ORDER — HEPARIN BOLUS VIA INFUSION
1400.0000 [IU] | INTRAVENOUS | Status: AC
  Administered 2021-03-07: 1400 [IU] via INTRAVENOUS
  Filled 2021-03-07: qty 1400

## 2021-03-07 MED ORDER — LAMOTRIGINE 25 MG PO TABS
100.0000 mg | ORAL_TABLET | Freq: Two times a day (BID) | ORAL | Status: DC
  Administered 2021-03-07 – 2021-03-09 (×5): 100 mg via ORAL
  Filled 2021-03-07: qty 4
  Filled 2021-03-07 (×4): qty 1

## 2021-03-07 NOTE — Progress Notes (Signed)
ANTICOAGULATION CONSULT NOTE  Pharmacy Consult for heparin infusion Indication: arterial occlusion  Not on File  Patient Measurements: Height: 5\' 7"  (170.2 cm) Weight: 47.6 kg (105 lb) IBW/kg (Calculated) : 66.1 Heparin Dosing Weight: 47.6 kg  Vital Signs: BP: 152/76 (06/02 0130) Pulse Rate: 52 (06/02 0130)  Labs: Recent Labs    03/06/21 1619 03/07/21 0200 03/07/21 0328  HGB 11.0*  --  8.9*  HCT 31.6*  --  26.1*  PLT 272  --  217  LABPROT 12.6  --   --   INR 0.9  --   --   HEPARINUNFRC  --  0.15*  --   CREATININE 0.76  --  0.67    Estimated Creatinine Clearance: 67.8 mL/min (by C-G formula based on SCr of 0.67 mg/dL).   Medical History: Past Medical History:  Diagnosis Date  . Hypertension   . Scoliosis   . Seizures ([name])     Medications:  Per chart review and spoke with nurse who verified with pt that he does not take any anticoagulation at home.   Assessment: 60 y.o. male who presented to the emergency department concern for a "clot" in his foot as he had seen a black spot to the middle toe of the right foot. Pt has no history of DVT or PE. Pharmacy has been consulted for heparin dosing and monitoring for arterial occlusion.   Baseline labs: PT 12.6, INR 0.9, Hgb 11.0, Hct 31.6, Plt 272  Goal of Therapy:  Heparin level 0.3-0.7 units/ml Monitor platelets by anticoagulation protocol: Yes  6/02 0200 HL 0.15   Plan:   Give 1400 units bolus x 1  Increase heparin infusion to 950 units/hr  Recheck HL in 6 hours after rate change  Continue to monitor H&H and platelets  8/02, PharmD, [facility] 03/07/2021 3:58 AM

## 2021-03-07 NOTE — Plan of Care (Addendum)
Patient transferring to room 256 - floor 2A.  Report given and Quinn Plowman, RN will be continuing care once arrives on floor.    Bed changed to 241.

## 2021-03-07 NOTE — Progress Notes (Signed)
ANTICOAGULATION CONSULT NOTE  Pharmacy Consult for heparin infusion Indication: arterial occlusion  No Known Allergies  Patient Measurements: Height: 5\' 7"  (170.2 cm) Weight: 47.6 kg (105 lb) IBW/kg (Calculated) : 66.1 Heparin Dosing Weight: 47.6 kg  Vital Signs: BP: 154/84 (06/02 0600) Pulse Rate: 58 (06/02 0600)  Labs: Recent Labs    03/06/21 1619 03/07/21 0200 03/07/21 0328 03/07/21 1021  HGB 11.0*  --  8.9*  --   HCT 31.6*  --  26.1*  --   PLT 272  --  217  --   LABPROT 12.6  --   --   --   INR 0.9  --   --   --   HEPARINUNFRC  --  0.15*  --  0.50  CREATININE 0.76  --  0.67  --     Estimated Creatinine Clearance: 67.8 mL/min (by C-G formula based on SCr of 0.67 mg/dL).   Medical History: Past Medical History:  Diagnosis Date  . Hypertension   . Scoliosis   . Seizures ([name])     Medications:  Per chart review and spoke with nurse who verified with pt that he does not take any anticoagulation at home.   Assessment: 60 y.o. male who presented to the emergency department concern for a "clot" in his foot as he had seen a black spot to the middle toe of the right foot. Pt has no history of DVT or PE. Pharmacy has been consulted for heparin dosing and monitoring for arterial occlusion.   Baseline labs: PT 12.6, INR 0.9, Hgb 11.0, Hct 31.6, Plt 272  Goal of Therapy:  Heparin level 0.3-0.7 units/ml Monitor platelets by anticoagulation protocol: Yes  6/02 0200 HL 0.15 6/02 1021 HL 0.50   Plan:   Heparin level therapeutic  Continue heparin infusion at 950 units/hr  Recheck HL in 6 hours to confirm  Continue to monitor H&H and platelets  8/02, PharmD Clinical Pharmacist 03/07/2021 11:13 AM

## 2021-03-07 NOTE — ED Notes (Signed)
No change in condition, will continue to monitor.  

## 2021-03-07 NOTE — Progress Notes (Signed)
[facility]  - [place] [facility]   PATIENT NAME: Albert [name]    MR#:  [id number]  DATE OF BIRTH:  August 26, 1961  SUBJECTIVE:  made after patient noted right fourth digit turning black. Intermittent pain.  REVIEW OF SYSTEMS:   Review of Systems  Constitutional: Negative for chills, fever and weight loss.  HENT: Negative for ear discharge, ear pain and nosebleeds.   Eyes: Negative for blurred vision, pain and discharge.  Respiratory: Negative for sputum production, shortness of breath, wheezing and stridor.   Cardiovascular: Negative for chest pain, palpitations, orthopnea and PND.  Gastrointestinal: Negative for abdominal pain, diarrhea, nausea and vomiting.  Genitourinary: Negative for frequency and urgency.  Musculoskeletal: Positive for joint pain. Negative for back pain.  Neurological: Negative for sensory change, speech change, focal weakness and weakness.  Psychiatric/Behavioral: Negative for depression and hallucinations. The patient is not nervous/anxious.    Tolerating Diet:yes Tolerating PT:   DRUG ALLERGIES:  No Known Allergies  VITALS:  Blood pressure (!) 160/92, pulse (!) 53, temperature 97.8 F (36.6 C), resp. rate 18, height 5\' 7"  (1.702 m), weight 47.6 kg, SpO2 100 %.  PHYSICAL EXAMINATION:   Physical Exam  GENERAL:  60 y.o.-year-old patient lying in the bed with no acute distress.  LUNGS: Normal breath sounds bilaterally, no wheezing, rales, rhonchi. No use of accessory muscles of respiration.  CARDIOVASCULAR: S1, S2 normal. No murmurs, rubs, or gallops.  ABDOMEN: Soft, nontender, nondistended. Bowel sounds present. No organomegaly or mass.  EXTREMITIES:   .    NEUROLOGIC:nonfocal   PSYCHIATRIC:  patient is alert and oriented x 3.  SKIN: No obvious rash, lesion, or ulcer.   LABORATORY PANEL:  CBC Recent Labs  Lab 03/07/21 0328  WBC 6.2  HGB 8.9*  HCT 26.1*  PLT 217    Chemistries  Recent Labs  Lab 03/06/21 1619  03/07/21 0200 03/07/21 0328  NA 135  --  138  K 2.8*  --  3.2*  CL 94*  --  103  CO2 30  --  26  GLUCOSE 115*  --  90  BUN 6  --  <5*  CREATININE 0.76  --  0.67  CALCIUM 8.8*  --  7.6*  MG  --  1.5*  --   AST 27  --   --   ALT 10  --   --   ALKPHOS 119  --   --   BILITOT 0.8  --   --    Cardiac Enzymes No results for input(s): TROPONINI in the last 168 hours. RADIOLOGY:  CT Angio Aortobifemoral W and/or Wo Contrast  Result Date: 03/06/2021 CLINICAL DATA:  Discoloration of the right fourth toe with right foot pain EXAM: CT ANGIOGRAPHY OF ABDOMINAL AORTA WITH ILIOFEMORAL RUNOFF TECHNIQUE: Multidetector CT imaging of the abdomen, pelvis and lower extremities was performed using the standard protocol during bolus administration of intravenous contrast. Multiplanar CT image reconstructions and MIPs were obtained to evaluate the vascular anatomy. CONTRAST:  05/06/2021 OMNIPAQUE IOHEXOL 350 MG/ML SOLN COMPARISON:  None. FINDINGS: VASCULAR Aorta: Atherosclerotic calcifications are noted with considerable mural thrombus within the infrarenal aorta. Celiac: Patent without evidence of aneurysm, dissection, vasculitis or significant stenosis. SMA: Patent without evidence of aneurysm, dissection, vasculitis or significant stenosis. Renals: Both renal arteries are patent without evidence of aneurysm, dissection, vasculitis, fibromuscular dysplasia or significant stenosis. IMA: Patent without evidence of aneurysm, dissection, vasculitis or significant stenosis. RIGHT Lower Extremity Inflow: Atherosclerotic calcifications are noted within the common and external  iliac arteries on the right. Irregular filling defect is noted at the level of the iliac bifurcation with occlusion of the right internal iliac artery at its origin with reconstitution via multiple muscular collaterals in the deeper pelvis. Additionally there is high-grade narrowing of the distal aspect of the common iliac artery extending into the external  iliac artery. The external iliac artery is within normal limits. Runoff: Superficial femoral artery is widely patent. Atherosclerotic changes in the proximal and mid popliteal artery are noted. Popliteal trifurcation is patent with 2 vessel runoff to the level of the right foot. Digital arteries are noted extending into the distal aspect of the foot without occlusive change. LEFT Lower Extremity Inflow: There is occlusion of the left common iliac artery at its origin related to the mural thrombus in the distal aorta. Reconstitution of the proximal aspect left internal iliac artery is noted although multiple filling defects are noted within consistent with thrombi/emboli. Reconstitution of the common femoral artery is noted primarily via the inferior epigastric artery on the left as well as multiple muscular collaterals to include the deep circumflex iliac artery on the left. Runoff: Common femoral bifurcation is patent although a tubular filling defect is noted in the proximal aspect of the left superficial femoral artery consistent with thrombus. This is attached at the level of the common femoral artery. Distal superficial femoral artery is within normal limits. Popliteal artery is patent as well. The popliteal trifurcation however shows only patency of the anterior tibial artery proximally. Some segmental reconstitution of the peroneal artery is seen. Mid calf occlusion of the anterior tibial artery is noted with some distal reconstitution via muscular collaterals. Mild reconstitution of the distal aspect of the posterior tibial artery is noted as well. Evaluation of the foot is limited due to the multiple areas of occlusion. Veins: No specific venous abnormality is noted. Review of the MIP images confirms the above findings. NON-VASCULAR Lower chest: No acute abnormality. Hepatobiliary: Mild dependent gallstones are seen without complicating factors. The liver is unremarkable. Pancreas: Unremarkable. No  pancreatic ductal dilatation or surrounding inflammatory changes. Spleen: Normal in size without focal abnormality. Adrenals/Urinary Tract: Adrenal glands are mildly hypertrophied without focal mass. Kidneys demonstrate scattered hypodensities likely related to renal cyst but incompletely evaluated this exam. 7 mm nonobstructing stone is noted in the midportion of the left kidney. The ureters are within normal limits. Bladder is partially distended. Stomach/Bowel: Scattered diverticular change of the colon is noted without evidence of diverticulitis. The appendix is within normal limits. Small bowel and stomach are unremarkable. Lymphatic: No significant lymphadenopathy is noted. Reproductive: Prostate is within normal limits. Other: No abdominal wall hernia or abnormality. No abdominopelvic ascites. Musculoskeletal: No acute bony abnormality is noted. 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 Irregular filling defect within the distal common iliac artery on the right extending into the proximal aspect of the external iliac artery on the right with high-grade stenosis identified. Occlusion of the right internal iliac artery at its origin is noted secondary to this filling defect. This is highly suspicious for underlying embolus. Runoff on the right shows atherosclerotic disease although 2 vessel runoff to the level of the right foot and subsequently into the foot is identified. The left common iliac artery occlusion extends to the level of the common femoral artery with significant reconstitution via the deep circumflex iliac artery and inferior epigastric arteries on the left. Some reconstitution of  the distal aspect of the left internal iliac artery is noted although multifocal filling defects and occlusive changes are seen again highly suspicious for emboli. Runoff on the left shows a tubular  filling defect within the proximal superficial femoral artery which appears attached to the arterial wall in the distal common iliac artery. More distal runoff shows occlusion of the tibioperoneal trunk with minimal reconstitution of the peroneal and posterior tibial arteries in the distal left leg. The anterior tibial artery is patent proximally but demonstrates short segment occlusion in the mid to distal calf with reconstitution via muscular collaterals. More distal flow into the foot is not well visualized in part due to the multifocal occlusions. Correlation with Doppler pulses is recommended. NON-VASCULAR Cholelithiasis without complicating factors. Nonobstructing left renal stone as described. Diverticulosis without diverticulitis. Cystic changes in the kidneys bilaterally. These are stable from a prior MRI from October of 2021. Critical Value/emergent results were called by telephone at the time of interpretation on 03/06/2021 at 7:10 pm to [facility] , who verbally acknowledged these results. Electronically Signed   By: Alcide Clever M.D.   On: 03/06/2021 19:09   DG Foot Complete Right  Result Date: 03/06/2021 CLINICAL DATA:  Toe necrosis, foot pain EXAM: RIGHT FOOT COMPLETE - 3+ VIEW COMPARISON:  None. FINDINGS: Degenerative changes of the 1st MTP joint with joint space narrowing and spurring. No acute bony abnormality. Specifically, no fracture, subluxation, or dislocation. Plantar calcaneal spur. IMPRESSION: No acute bony abnormality. Electronically Signed   By: Charlett Nose M.D.   On: 03/06/2021 17:21   ASSESSMENT AND PLAN:  Kadar Prevatteis a 59 year oldmalewith medical history significant forseizure disorder, hypertension and nicotine dependence who presents to the ER for evaluation of concerns for possible clot in his right foot. Patient states he noted a black discoloration over the fourth toe on his right foot as well as some discoloration of the fifth toe.  Atherosclerotic  disease right lower extremity Right fourth toe dry gangrene -- CT angiogram shows multilevel peripheral vascular disease. -- Currently on IV heparin drip -- vascular consultation appreciated. Plans for angiogram tomorrow with Dr. Wyn Quaker  Nicotine abuse -- patient recommended smoking cessation -- continue nicotine patch  Hypertension started on amlodipine   Procedures: Family communication : none Consults : vascular CODE STATUS: full DVT Prophylaxis : Level of care: Progressive Cardiac Status is: Inpatient  Remains inpatient appropriate because:Inpatient level of care appropriate due to severity of illness   Dispo: The patient is from: Home              Anticipated d/c is to: Home              Patient currently is not medically stable to d/c.         TOTAL TIME TAKING CARE OF THIS PATIENT: *25* minutes.  >50% time spent on counselling and coordination of care  Note: This dictation was prepared with Dragon dictation along with smaller phrase technology. Any transcriptional errors that result from this process are unintentional.  Enedina Finner M.D    [facility]   CC: [facility]; [facility]Patient ID: Cristela Blue, male   DOB: December 08, 1960, 60 y.o.   MRN: [id number]

## 2021-03-07 NOTE — ED Notes (Signed)
Report received from [facility]. Patient care assumed. Patient/RN introduction complete. Will continue to monitor. Pt watching TV, co right foot painr rates it 8/10 at this time but is able to rest. Call bell within reach, awaiting hospital room availability.

## 2021-03-07 NOTE — Progress Notes (Signed)
ANTICOAGULATION CONSULT NOTE  Pharmacy Consult for heparin infusion Indication: arterial occlusion  No Known Allergies  Patient Measurements: Height: 5\' 7"  (170.2 cm) Weight: 47.6 kg (105 lb) IBW/kg (Calculated) : 66.1 Heparin Dosing Weight: 47.6 kg  Vital Signs: Temp: 97.8 F (36.6 C) (06/02 1503) BP: 160/92 (06/02 1503) Pulse Rate: 53 (06/02 1503)  Labs: Recent Labs    03/06/21 1619 03/07/21 0200 03/07/21 0328 03/07/21 1021 03/07/21 1716  HGB 11.0*  --  8.9*  --   --   HCT 31.6*  --  26.1*  --   --   PLT 272  --  217  --   --   LABPROT 12.6  --   --   --   --   INR 0.9  --   --   --   --   HEPARINUNFRC  --  0.15*  --  0.50 0.51  CREATININE 0.76  --  0.67  --   --     Estimated Creatinine Clearance: 67.8 mL/min (by C-G formula based on SCr of 0.67 mg/dL).   Medical History: Past Medical History:  Diagnosis Date  . Hypertension   . Scoliosis   . Seizures ([name])     Medications:  Per chart review and spoke with nurse who verified with pt that he does not take any anticoagulation at home.   Assessment: 60 y.o. male who presented to the emergency department concern for a "clot" in his foot as he had seen a black spot to the middle toe of the right foot. Pt has no history of DVT or PE. Pharmacy has been consulted for heparin dosing and monitoring for arterial occlusion.   Baseline labs: PT 12.6, INR 0.9, Hgb 11.0, Hct 31.6, Plt 272  Goal of Therapy:  Heparin level 0.3-0.7 units/ml Monitor platelets by anticoagulation protocol: Yes  6/02 0200 HL 0.15 6/02 1021 HL 0.50 6/02 1716 HL 0.51    Plan:   Heparin level therapeutic x 2   Continue heparin infusion at 950 units/hr  Recheck HL and CBC with AM labs.   8/02, PharmD, BCPS Clinical Pharmacist 03/07/2021 5:48 PM

## 2021-03-07 NOTE — H&P (View-Only) (Signed)
[facility] Vascular Consult Note  MRN : [id number]  Albert [name] is a 60 y.o. (1961-06-26) male who presents with chief complaint of  Chief Complaint  Patient presents with  . Foot Pain   History of Present Illness: Albert [name] is a 60 year old male with medical history significant for seizure disorder, hypertension and nicotine dependence who presents to the ER for evaluation of concerns for possible clot in his right foot.    Patient states he noted a black discoloration over the fourth toe on his right foot as well as some discoloration of the fifth toe. He had discussed this with his neighbor who is a paramedic and who advised him to go get checked for a possible blood clot. He complains of diarrhea but denies having any abdominal pain. He denies having any fever or chills, no chest pain, no shortness of breath, no abdominal pain, no nausea, no vomiting, no hematochezia, no melena, no hematemesis, no dizziness, no lightheadedness, no headache, no blurred vision, no focal deficits.  Right foot x-ray shows no acute bony abnormality.  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 Irregular filling defect within the distal common iliac artery on the right extending into the proximal aspect of the external iliac artery on the right with high-grade stenosis identified. Occlusion of the right internal iliac artery at its origin is noted secondary to this filling defect. This is highly suspicious for underlying embolus. Runoff on the right shows atherosclerotic disease although 2 vessel runoff to the level of the right foot and subsequently into the foot is identified. The left common iliac artery occlusion extends to the level of the common femoral artery with significant reconstitution via the deep circumflex  iliac artery and inferior epigastric arteries on the left. Some reconstitution of the distal aspect of the left internal iliac artery is noted although multifocal filling defects and occlusive changes are seen again highly suspicious for emboli. Runoff on the left shows a tubular filling defect within the proximal superficial femoral artery which appears attached to the arterial wall in the distal common iliac artery. More distal runoff shows occlusion of the tibioperoneal trunk with minimal reconstitution of the peroneal and posterior tibial arteries in the distal left leg. The anterior tibial artery is patent proximally but demonstrates short segment occlusion in the mid to distal calf with reconstitution via muscular collaterals. More distal flow into the foot is not well visualized in part due to the multifocal occlusions. Correlation with Doppler pulses is recommended.  ED Course: Patient is a 60 year old male who presents to the ER for evaluation of gangrene  involving the fourth toe on his right foot as well as the right fifth toe.  He has a significant history of nicotine use.  Imaging shows extensive thrombus from the infrarenal abdominal aorta extending into the lower extremities. Patient has been started on heparin drip in the ER and will be admitted to the hospital for further evaluation.  Vascular surgery was consulted by Dr. Allena Katz for possible endovascular intervention.  Current Facility-Administered Medications  Medication Dose Route Frequency Provider Last Rate Last Admin  . 0.9 % NaCl with KCl 40 mEq / L  infusion   Intravenous Continuous Agbata, Tochukwu, MD 125 mL/hr at 03/06/21 2222 New Bag at 03/06/21 2222  . acetaminophen (TYLENOL) tablet 650 mg  650 mg Oral Q6H  PRN Lucile Shutters, MD       Or  . acetaminophen (TYLENOL) suppository 650 mg  650 mg Rectal Q6H PRN Agbata, Tochukwu, MD      . atorvastatin (LIPITOR) tablet 80 mg  80 mg Oral Daily Agbata, Tochukwu, MD      .  heparin ADULT infusion 100 units/mL (25000 units/255mL)  950 Units/hr Intravenous Continuous Otelia Sergeant, RPH 9.5 mL/hr at 03/07/21 0413 950 Units/hr at 03/07/21 0413  . hydrALAZINE (APRESOLINE) injection 10 mg  10 mg Intravenous Q6H PRN Agbata, Tochukwu, MD      . LORazepam (ATIVAN) injection 2 mg  2 mg Intravenous Q4H PRN Agbata, Tochukwu, MD      . morphine 2 MG/ML injection 2 mg  2 mg Intravenous Q4H PRN Agbata, Tochukwu, MD   2 mg at 03/07/21 0844  . nicotine (NICODERM CQ - dosed in mg/24 hours) patch 21 mg  21 mg Transdermal Daily Agbata, Tochukwu, MD   21 mg at 03/07/21 0833  . ondansetron (ZOFRAN) tablet 4 mg  4 mg Oral Q6H PRN Agbata, Tochukwu, MD       Or  . ondansetron (ZOFRAN) injection 4 mg  4 mg Intravenous Q6H PRN Agbata, Tochukwu, MD      . potassium chloride SA (KLOR-CON) CR tablet 40 mEq  40 mEq Oral BID Agbata, Tochukwu, MD   40 mEq at 03/06/21 2234   Current Outpatient Medications  Medication Sig Dispense Refill  . lamoTRIgine (LAMICTAL) 100 MG tablet Take 100 mg by mouth in the morning and at bedtime.    . lamoTRIgine (LAMICTAL) 25 MG tablet Take 25-50 mg by mouth See admin instructions. Take 1 tablet (25 mg) every morning and 2 tablets (50 mg) every evening.    . levETIRAcetam (KEPPRA) 750 MG tablet Take 750 mg by mouth 2 (two) times daily.    Marland Kitchen gabapentin (NEURONTIN) 100 MG capsule Take 1 capsule by mouth 3 (three) times daily. (Patient not taking: Reported on 03/07/2021)     Past Medical History:  Diagnosis Date  . Hypertension   . Scoliosis   . Seizures ([name])    History reviewed. No pertinent surgical history.  Social History Social History   Tobacco Use  . Smoking status: Current Every Day Smoker  . Smokeless tobacco: Never Used  Substance Use Topics  . Alcohol use: Yes    Comment: occ  . Drug use: Yes    Types: Marijuana   Family History Family History  Problem Relation Age of Onset  . Hypertension Father   Denies family history of peripheral  artery disease, venous disease or renal disease.  No Known Allergies  REVIEW OF SYSTEMS (Negative unless checked)  Constitutional: [] Weight loss  [] Fever  [] Chills Cardiac: [] Chest pain   [] Chest pressure   [] Palpitations   [] Shortness of breath when laying flat   [] Shortness of breath at rest   [] Shortness of breath with exertion. Vascular:  [] Pain in legs with walking   [] Pain in legs at rest   [] Pain in legs when laying flat   [] Claudication   [x] Pain in feet when walking  [x] Pain in feet at rest  [x] Pain in feet when laying flat   [] History of DVT   [] Phlebitis   [] Swelling in legs   [] Varicose veins   [] Non-healing ulcers Pulmonary:   [] Uses home oxygen   [] Productive cough   [] Hemoptysis   [] Wheeze  [] COPD   [] Asthma Neurologic:  [] Dizziness  [] Blackouts   [] Seizures   [] History of stroke   []   History of TIA  Aphasia   Temporary blindness   Dysphagia   Weakness or numbness in arms   Weakness or numbness in legs Musculoskeletal:  Arthritis   Joint swelling   Joint pain   Low back pain Hematologic:  Easy bruising  Easy bleeding   Hypercoagulable state   Anemic  Hepatitis Gastrointestinal:  Blood in stool   Vomiting blood  Gastroesophageal reflux/heartburn   Difficulty swallowing. Genitourinary:  Chronic kidney disease   Difficult urination  Frequent urination  Burning with urination   Blood in urine Skin:  Rashes   Ulcers   Wounds Psychological:  History of anxiety    History of major depression.  Physical Examination  Vitals:   03/07/21 0130 03/07/21 0300 03/07/21 0500 03/07/21 0600  BP: (!) 152/76 (!) 153/75 (!) 145/87 (!) 154/84  Pulse: (!) 52 (!) 55 (!) 51 (!) 58  Resp:  15 15   Temp:      TempSrc:      SpO2: 100% 100% 100% 100%  Weight:      Height:       Body mass index is 16.45 kg/m. Gen:  WD/WN, NAD Head: [place]/AT, No temporalis wasting. Prominent temp pulse not noted. Ear/Nose/Throat: Hearing grossly intact, nares w/o  erythema or drainage, oropharynx w/o Erythema/Exudate Eyes: Sclera non-icteric, conjunctiva clear Neck: Trachea midline.  No JVD.  Pulmonary:  Good air movement, respirations not labored, equal bilaterally.  Cardiac: RRR, normal S1, S2. Vascular:  Vessel Right Left  Radial Palpable Palpable  Ulnar Palpable Palpable  Brachial Palpable Palpable  Carotid Palpable, without bruit Palpable, without bruit  Aorta Not palpable N/A  Femoral Palpable Palpable  Popliteal Palpable Palpable  PT Palpable Palpable  DP Palpable Palpable   Right lower extremity: Thigh soft.  Calf soft.  Extremities warm distally toes.  Faintly palpable pedal pulses.  Small discoloration pinpoint in nature on the underside of the third toe.  Motor/sensory is intact minimal edema.  Gastrointestinal: soft, non-tender/non-distended. No guarding/reflex.  Musculoskeletal: M/S 5/5 throughout.  Extremities without ischemic changes.  No deformity or atrophy. No edema. Neurologic: Sensation grossly intact in extremities.  Symmetrical.  Speech is fluent. Motor exam as listed above. Psychiatric: Judgment intact, Mood & affect appropriate for pt's clinical situation. Dermatologic: As above Lymph : No Cervical, Axillary, or Inguinal lymphadenopathy.  CBC Lab Results  Component Value Date   WBC 6.2 03/07/2021   HGB 8.9 (L) 03/07/2021   HCT 26.1 (L) 03/07/2021   MCV 101.6 (H) 03/07/2021   PLT 217 03/07/2021   BMET    Component Value Date/Time   NA 138 03/07/2021 0328   K 3.2 (L) 03/07/2021 0328   CL 103 03/07/2021 0328   CO2 26 03/07/2021 0328   GLUCOSE 90 03/07/2021 0328   BUN <5 (L) 03/07/2021 0328   CREATININE 0.67 03/07/2021 0328   CALCIUM 7.6 (L) 03/07/2021 0328   GFRNONAA >60 03/07/2021 0328   Estimated Creatinine Clearance: 67.8 mL/min (by C-G formula based on SCr of 0.67 mg/dL).  COAG Lab Results  Component Value Date   INR 0.9 03/06/2021   Radiology CT Angio Aortobifemoral W and/or Wo  Contrast  Result Date: 03/06/2021 CLINICAL DATA:  Discoloration of the right fourth toe with right foot pain EXAM: CT ANGIOGRAPHY OF ABDOMINAL AORTA WITH ILIOFEMORAL RUNOFF TECHNIQUE: Multidetector CT imaging of the abdomen, pelvis and lower extremities was performed using the standard protocol during bolus administration of intravenous contrast. Multiplanar CT image reconstructions and MIPs were obtained to evaluate the vascular  anatomy. CONTRAST:  OMNIPAQUE IOHEXOL 350 MG/ML SOLN COMPARISON:  None. FINDINGS: VASCULAR Aorta: Atherosclerotic calcifications are noted with considerable mural thrombus within the infrarenal aorta. Celiac: Patent without evidence of aneurysm, dissection, vasculitis or significant stenosis. SMA: Patent without evidence of aneurysm, dissection, vasculitis or significant stenosis. Renals: Both renal arteries are patent without evidence of aneurysm, dissection, vasculitis, fibromuscular dysplasia or significant stenosis. IMA: Patent without evidence of aneurysm, dissection, vasculitis or significant stenosis. RIGHT Lower Extremity Inflow: Atherosclerotic calcifications are noted within the common and external iliac arteries on the right. Irregular filling defect is noted at the level of the iliac bifurcation with occlusion of the right internal iliac artery at its origin with reconstitution via multiple muscular collaterals in the deeper pelvis. Additionally there is high-grade narrowing of the distal aspect of the common iliac artery extending into the external iliac artery. The external iliac artery is within normal limits. Runoff: Superficial femoral artery is widely patent. Atherosclerotic changes in the proximal and mid popliteal artery are noted. Popliteal trifurcation is patent with 2 vessel runoff to the level of the right foot. Digital arteries are noted extending into the distal aspect of the foot without occlusive change. LEFT Lower Extremity Inflow: There is occlusion of  the left common iliac artery at its origin related to the mural thrombus in the distal aorta. Reconstitution of the proximal aspect left internal iliac artery is noted although multiple filling defects are noted within consistent with thrombi/emboli. Reconstitution of the common femoral artery is noted primarily via the inferior epigastric artery on the left as well as multiple muscular collaterals to include the deep circumflex iliac artery on the left. Runoff: Common femoral bifurcation is patent although a tubular filling defect is noted in the proximal aspect of the left superficial femoral artery consistent with thrombus. This is attached at the level of the common femoral artery. Distal superficial femoral artery is within normal limits. Popliteal artery is patent as well. The popliteal trifurcation however shows only patency of the anterior tibial artery proximally. Some segmental reconstitution of the peroneal artery is seen. Mid calf occlusion of the anterior tibial artery is noted with some distal reconstitution via muscular collaterals. Mild reconstitution of the distal aspect of the posterior tibial artery is noted as well. Evaluation of the foot is limited due to the multiple areas of occlusion. Veins: No specific venous abnormality is noted. Review of the MIP images confirms the above findings. NON-VASCULAR Lower chest: No acute abnormality. Hepatobiliary: Mild dependent gallstones are seen without complicating factors. The liver is unremarkable. Pancreas: Unremarkable. No pancreatic ductal dilatation or surrounding inflammatory changes. Spleen: Normal in size without focal abnormality. Adrenals/Urinary Tract: Adrenal glands are mildly hypertrophied without focal mass. Kidneys demonstrate scattered hypodensities likely related to renal cyst but incompletely evaluated this exam. 7 mm nonobstructing stone is noted in the midportion of the left kidney. The ureters are within normal limits. Bladder is  partially distended. Stomach/Bowel: Scattered diverticular change of the colon is noted without evidence of diverticulitis. The appendix is within normal limits. Small bowel and stomach are unremarkable. Lymphatic: No significant lymphadenopathy is noted. Reproductive: Prostate is within normal limits. Other: No abdominal wall hernia or abnormality. No abdominopelvic ascites. Musculoskeletal: No acute bony abnormality is noted. 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 Irregular filling defect within the distal common iliac artery on the right extending into the proximal aspect of the external iliac artery  on the right with high-grade stenosis identified. Occlusion of the right internal iliac artery at its origin is noted secondary to this filling defect. This is highly suspicious for underlying embolus. Runoff on the right shows atherosclerotic disease although 2 vessel runoff to the level of the right foot and subsequently into the foot is identified. The left common iliac artery occlusion extends to the level of the common femoral artery with significant reconstitution via the deep circumflex iliac artery and inferior epigastric arteries on the left. Some reconstitution of the distal aspect of the left internal iliac artery is noted although multifocal filling defects and occlusive changes are seen again highly suspicious for emboli. Runoff on the left shows a tubular filling defect within the proximal superficial femoral artery which appears attached to the arterial wall in the distal common iliac artery. More distal runoff shows occlusion of the tibioperoneal trunk with minimal reconstitution of the peroneal and posterior tibial arteries in the distal left leg. The anterior tibial artery is patent proximally but demonstrates short segment occlusion in the mid to distal calf with reconstitution  via muscular collaterals. More distal flow into the foot is not well visualized in part due to the multifocal occlusions. Correlation with Doppler pulses is recommended. NON-VASCULAR Cholelithiasis without complicating factors. Nonobstructing left renal stone as described. Diverticulosis without diverticulitis. Cystic changes in the kidneys bilaterally. These are stable from a prior MRI from October of 2021. Critical Value/emergent results were called by telephone at the time of interpretation on 03/06/2021 at 7:10 pm to [facility] , who verbally acknowledged these results. Electronically Signed   By: Alcide Clever M.D.   On: 03/06/2021 19:09   DG Foot Complete Right  Result Date: 03/06/2021 CLINICAL DATA:  Toe necrosis, foot pain EXAM: RIGHT FOOT COMPLETE - 3+ VIEW COMPARISON:  None. FINDINGS: Degenerative changes of the 1st MTP joint with joint space narrowing and spurring. No acute bony abnormality. Specifically, no fracture, subluxation, or dislocation. Plantar calcaneal spur. IMPRESSION: No acute bony abnormality. Electronically Signed   By: Charlett Nose M.D.   On: 03/06/2021 17:21   Assessment/Plan Albert [name] is a 60 year old male with medical history significant for seizure disorder, hypertension and nicotine dependence who presents to the ER for evaluation of concerns for possible clot in his right foot.   1.  Possible Atherosclerotic Disease to the Right Lower Extremity: Patient with multiple risk factors for atherosclerotic disease.  Presents with progressively worsening discomfort and discoloration to the toes on the right foot.  Imaging was notable for multilevel disease. Recommend the patient undergo an angiogram in an attempt to assess anatomy and contributing degree of atherosclerotic disease.  Patient will most likely need kissing stents to the iliac arteries as well intervention distally.  Procedure, risks and benefits were explained to the patient.  All questions were  answered.  The patient wishes to proceed.  We will plan on this tomorrow with Dr. Wyn Quaker.  2.  Tobacco abuse: We had a discussion for approximately three minutes regarding the absolute need for smoking cessation due to the deleterious nature of tobacco on the vascular system. We discussed the tobacco use would diminish patency of any intervention, and likely significantly worsen progressio of disease. We discussed multiple agents for quitting including replacement therapy or medications to reduce cravings such as Chantix. The patient voices their understanding of the importance of smoking cessation.  3.  Hypertension: On appropriate medications Encouraged good control as its slows the progression of atherosclerotic disease  Discussed with Dr. Weldon Inches, PA-C 03/07/2021 10:41 AM  This note was created with Dragon medical transcription system.  Any error is purely unintentional.

## 2021-03-07 NOTE — Consult Note (Signed)
[facility] Vascular Consult Note  MRN : [id number]  Albert [name] is a 60 y.o. (06/19/1961) male who presents with chief complaint of  Chief Complaint  Patient presents with  . Foot Pain   History of Present Illness: Albert [name] is a 60 year old male with medical history significant for seizure disorder, hypertension and nicotine dependence who presents to the ER for evaluation of concerns for possible clot in his right foot.    Patient states he noted a black discoloration over the fourth toe on his right foot as well as some discoloration of the fifth toe. He had discussed this with his neighbor who is a paramedic and who advised him to go get checked for a possible blood clot. He complains of diarrhea but denies having any abdominal pain. He denies having any fever or chills, no chest pain, no shortness of breath, no abdominal pain, no nausea, no vomiting, no hematochezia, no melena, no hematemesis, no dizziness, no lightheadedness, no headache, no blurred vision, no focal deficits.  Right foot x-ray shows no acute bony abnormality.  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 Irregular filling defect within the distal common iliac artery on the right extending into the proximal aspect of the external iliac artery on the right with high-grade stenosis identified. Occlusion of the right internal iliac artery at its origin is noted secondary to this filling defect. This is highly suspicious for underlying embolus. Runoff on the right shows atherosclerotic disease although 2 vessel runoff to the level of the right foot and subsequently into the foot is identified. The left common iliac artery occlusion extends to the level of the common femoral artery with significant reconstitution via the deep circumflex  iliac artery and inferior epigastric arteries on the left. Some reconstitution of the distal aspect of the left internal iliac artery is noted although multifocal filling defects and occlusive changes are seen again highly suspicious for emboli. Runoff on the left shows a tubular filling defect within the proximal superficial femoral artery which appears attached to the arterial wall in the distal common iliac artery. More distal runoff shows occlusion of the tibioperoneal trunk with minimal reconstitution of the peroneal and posterior tibial arteries in the distal left leg. The anterior tibial artery is patent proximally but demonstrates short segment occlusion in the mid to distal calf with reconstitution via muscular collaterals. More distal flow into the foot is not well visualized in part due to the multifocal occlusions. Correlation with Doppler pulses is recommended.  ED Course: Patient is a 60-year-old male who presents to the ER for evaluation of gangrene  involving the fourth toe on his right foot as well as the right fifth toe.  He has a significant history of nicotine use.  Imaging shows extensive thrombus from the infrarenal abdominal aorta extending into the lower extremities. Patient has been started on heparin drip in the ER and will be admitted to the hospital for further evaluation.  Vascular surgery was consulted by Dr. Patel for possible endovascular intervention.  Current Facility-Administered Medications  Medication Dose Route Frequency Provider Last Rate Last Admin  . 0.9 % NaCl with KCl 40 mEq / L  infusion   Intravenous Continuous Agbata, Tochukwu, MD 125 mL/hr at 03/06/21 2222 New Bag at 03/06/21 2222  . acetaminophen (TYLENOL) tablet 650 mg  650 mg Oral Q6H   PRN Agbata, Tochukwu, MD       Or  . acetaminophen (TYLENOL) suppository 650 mg  650 mg Rectal Q6H PRN Agbata, Tochukwu, MD      . atorvastatin (LIPITOR) tablet 80 mg  80 mg Oral Daily Agbata, Tochukwu, MD      .  heparin ADULT infusion 100 units/mL (25000 units/250mL)  950 Units/hr Intravenous Continuous Belue, Nathan S, RPH 9.5 mL/hr at 03/07/21 0413 950 Units/hr at 03/07/21 0413  . hydrALAZINE (APRESOLINE) injection 10 mg  10 mg Intravenous Q6H PRN Agbata, Tochukwu, MD      . LORazepam (ATIVAN) injection 2 mg  2 mg Intravenous Q4H PRN Agbata, Tochukwu, MD      . morphine 2 MG/ML injection 2 mg  2 mg Intravenous Q4H PRN Agbata, Tochukwu, MD   2 mg at 03/07/21 0844  . nicotine (NICODERM CQ - dosed in mg/24 hours) patch 21 mg  21 mg Transdermal Daily Agbata, Tochukwu, MD   21 mg at 03/07/21 0833  . ondansetron (ZOFRAN) tablet 4 mg  4 mg Oral Q6H PRN Agbata, Tochukwu, MD       Or  . ondansetron (ZOFRAN) injection 4 mg  4 mg Intravenous Q6H PRN Agbata, Tochukwu, MD      . potassium chloride SA (KLOR-CON) CR tablet 40 mEq  40 mEq Oral BID Agbata, Tochukwu, MD   40 mEq at 03/06/21 2234   Current Outpatient Medications  Medication Sig Dispense Refill  . lamoTRIgine (LAMICTAL) 100 MG tablet Take 100 mg by mouth in the morning and at bedtime.    . lamoTRIgine (LAMICTAL) 25 MG tablet Take 25-50 mg by mouth See admin instructions. Take 1 tablet (25 mg) every morning and 2 tablets (50 mg) every evening.    . levETIRAcetam (KEPPRA) 750 MG tablet Take 750 mg by mouth 2 (two) times daily.    . gabapentin (NEURONTIN) 100 MG capsule Take 1 capsule by mouth 3 (three) times daily. (Patient not taking: Reported on 03/07/2021)     Past Medical History:  Diagnosis Date  . Hypertension   . Scoliosis   . Seizures ([name])    History reviewed. No pertinent surgical history.  Social History Social History   Tobacco Use  . Smoking status: Current Every Day Smoker  . Smokeless tobacco: Never Used  Substance Use Topics  . Alcohol use: Yes    Comment: occ  . Drug use: Yes    Types: Marijuana   Family History Family History  Problem Relation Age of Onset  . Hypertension Father   Denies family history of peripheral  artery disease, venous disease or renal disease.  No Known Allergies  REVIEW OF SYSTEMS (Negative unless checked)  Constitutional: []Weight loss  []Fever  []Chills Cardiac: []Chest pain   []Chest pressure   []Palpitations   []Shortness of breath when laying flat   []Shortness of breath at rest   []Shortness of breath with exertion. Vascular:  []Pain in legs with walking   []Pain in legs at rest   []Pain in legs when laying flat   []Claudication   [x]Pain in feet when walking  [x]Pain in feet at rest  [x]Pain in feet when laying flat   []History of DVT   []Phlebitis   []Swelling in legs   []Varicose veins   []Non-healing ulcers Pulmonary:   []Uses home oxygen   []Productive cough   []Hemoptysis   []Wheeze  []COPD   []Asthma Neurologic:  []Dizziness  []Blackouts   []Seizures   []History of stroke   []  History of TIA  []Aphasia   []Temporary blindness   []Dysphagia   []Weakness or numbness in arms   []Weakness or numbness in legs Musculoskeletal:  []Arthritis   []Joint swelling   []Joint pain   []Low back pain Hematologic:  []Easy bruising  []Easy bleeding   []Hypercoagulable state   []Anemic  []Hepatitis Gastrointestinal:  []Blood in stool   []Vomiting blood  []Gastroesophageal reflux/heartburn   []Difficulty swallowing. Genitourinary:  []Chronic kidney disease   []Difficult urination  []Frequent urination  []Burning with urination   []Blood in urine Skin:  []Rashes   []Ulcers   []Wounds Psychological:  []History of anxiety   [] History of major depression.  Physical Examination  Vitals:   03/07/21 0130 03/07/21 0300 03/07/21 0500 03/07/21 0600  BP: (!) 152/76 (!) 153/75 (!) 145/87 (!) 154/84  Pulse: (!) 52 (!) 55 (!) 51 (!) 58  Resp:  15 15   Temp:      TempSrc:      SpO2: 100% 100% 100% 100%  Weight:      Height:       Body mass index is 16.45 kg/m. Gen:  WD/WN, NAD Head: [place]/AT, No temporalis wasting. Prominent temp pulse not noted. Ear/Nose/Throat: Hearing grossly intact, nares w/o  erythema or drainage, oropharynx w/o Erythema/Exudate Eyes: Sclera non-icteric, conjunctiva clear Neck: Trachea midline.  No JVD.  Pulmonary:  Good air movement, respirations not labored, equal bilaterally.  Cardiac: RRR, normal S1, S2. Vascular:  Vessel Right Left  Radial Palpable Palpable  Ulnar Palpable Palpable  Brachial Palpable Palpable  Carotid Palpable, without bruit Palpable, without bruit  Aorta Not palpable N/A  Femoral Palpable Palpable  Popliteal Palpable Palpable  PT Palpable Palpable  DP Palpable Palpable   Right lower extremity: Thigh soft.  Calf soft.  Extremities warm distally toes.  Faintly palpable pedal pulses.  Small discoloration pinpoint in nature on the underside of the third toe.  Motor/sensory is intact minimal edema.  Gastrointestinal: soft, non-tender/non-distended. No guarding/reflex.  Musculoskeletal: M/S 5/5 throughout.  Extremities without ischemic changes.  No deformity or atrophy. No edema. Neurologic: Sensation grossly intact in extremities.  Symmetrical.  Speech is fluent. Motor exam as listed above. Psychiatric: Judgment intact, Mood & affect appropriate for pt's clinical situation. Dermatologic: As above Lymph : No Cervical, Axillary, or Inguinal lymphadenopathy.  CBC Lab Results  Component Value Date   WBC 6.2 03/07/2021   HGB 8.9 (L) 03/07/2021   HCT 26.1 (L) 03/07/2021   MCV 101.6 (H) 03/07/2021   PLT 217 03/07/2021   BMET    Component Value Date/Time   NA 138 03/07/2021 0328   K 3.2 (L) 03/07/2021 0328   CL 103 03/07/2021 0328   CO2 26 03/07/2021 0328   GLUCOSE 90 03/07/2021 0328   BUN <5 (L) 03/07/2021 0328   CREATININE 0.67 03/07/2021 0328   CALCIUM 7.6 (L) 03/07/2021 0328   GFRNONAA >60 03/07/2021 0328   Estimated Creatinine Clearance: 67.8 mL/min (by C-G formula based on SCr of 0.67 mg/dL).  COAG Lab Results  Component Value Date   INR 0.9 03/06/2021   Radiology CT Angio Aortobifemoral W and/or Wo  Contrast  Result Date: 03/06/2021 CLINICAL DATA:  Discoloration of the right fourth toe with right foot pain EXAM: CT ANGIOGRAPHY OF ABDOMINAL AORTA WITH ILIOFEMORAL RUNOFF TECHNIQUE: Multidetector CT imaging of the abdomen, pelvis and lower extremities was performed using the standard protocol during bolus administration of intravenous contrast. Multiplanar CT image reconstructions and MIPs were obtained to evaluate the vascular   anatomy. CONTRAST:  100mL OMNIPAQUE IOHEXOL 350 MG/ML SOLN COMPARISON:  None. FINDINGS: VASCULAR Aorta: Atherosclerotic calcifications are noted with considerable mural thrombus within the infrarenal aorta. Celiac: Patent without evidence of aneurysm, dissection, vasculitis or significant stenosis. SMA: Patent without evidence of aneurysm, dissection, vasculitis or significant stenosis. Renals: Both renal arteries are patent without evidence of aneurysm, dissection, vasculitis, fibromuscular dysplasia or significant stenosis. IMA: Patent without evidence of aneurysm, dissection, vasculitis or significant stenosis. RIGHT Lower Extremity Inflow: Atherosclerotic calcifications are noted within the common and external iliac arteries on the right. Irregular filling defect is noted at the level of the iliac bifurcation with occlusion of the right internal iliac artery at its origin with reconstitution via multiple muscular collaterals in the deeper pelvis. Additionally there is high-grade narrowing of the distal aspect of the common iliac artery extending into the external iliac artery. The external iliac artery is within normal limits. Runoff: Superficial femoral artery is widely patent. Atherosclerotic changes in the proximal and mid popliteal artery are noted. Popliteal trifurcation is patent with 2 vessel runoff to the level of the right foot. Digital arteries are noted extending into the distal aspect of the foot without occlusive change. LEFT Lower Extremity Inflow: There is occlusion of  the left common iliac artery at its origin related to the mural thrombus in the distal aorta. Reconstitution of the proximal aspect left internal iliac artery is noted although multiple filling defects are noted within consistent with thrombi/emboli. Reconstitution of the common femoral artery is noted primarily via the inferior epigastric artery on the left as well as multiple muscular collaterals to include the deep circumflex iliac artery on the left. Runoff: Common femoral bifurcation is patent although a tubular filling defect is noted in the proximal aspect of the left superficial femoral artery consistent with thrombus. This is attached at the level of the common femoral artery. Distal superficial femoral artery is within normal limits. Popliteal artery is patent as well. The popliteal trifurcation however shows only patency of the anterior tibial artery proximally. Some segmental reconstitution of the peroneal artery is seen. Mid calf occlusion of the anterior tibial artery is noted with some distal reconstitution via muscular collaterals. Mild reconstitution of the distal aspect of the posterior tibial artery is noted as well. Evaluation of the foot is limited due to the multiple areas of occlusion. Veins: No specific venous abnormality is noted. Review of the MIP images confirms the above findings. NON-VASCULAR Lower chest: No acute abnormality. Hepatobiliary: Mild dependent gallstones are seen without complicating factors. The liver is unremarkable. Pancreas: Unremarkable. No pancreatic ductal dilatation or surrounding inflammatory changes. Spleen: Normal in size without focal abnormality. Adrenals/Urinary Tract: Adrenal glands are mildly hypertrophied without focal mass. Kidneys demonstrate scattered hypodensities likely related to renal cyst but incompletely evaluated this exam. 7 mm nonobstructing stone is noted in the midportion of the left kidney. The ureters are within normal limits. Bladder is  partially distended. Stomach/Bowel: Scattered diverticular change of the colon is noted without evidence of diverticulitis. The appendix is within normal limits. Small bowel and stomach are unremarkable. Lymphatic: No significant lymphadenopathy is noted. Reproductive: Prostate is within normal limits. Other: No abdominal wall hernia or abnormality. No abdominopelvic ascites. Musculoskeletal: No acute bony abnormality is noted. 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 Irregular filling defect within the distal common iliac artery on the right extending into the proximal aspect of the external iliac artery   on the right with high-grade stenosis identified. Occlusion of the right internal iliac artery at its origin is noted secondary to this filling defect. This is highly suspicious for underlying embolus. Runoff on the right shows atherosclerotic disease although 2 vessel runoff to the level of the right foot and subsequently into the foot is identified. The left common iliac artery occlusion extends to the level of the common femoral artery with significant reconstitution via the deep circumflex iliac artery and inferior epigastric arteries on the left. Some reconstitution of the distal aspect of the left internal iliac artery is noted although multifocal filling defects and occlusive changes are seen again highly suspicious for emboli. Runoff on the left shows a tubular filling defect within the proximal superficial femoral artery which appears attached to the arterial wall in the distal common iliac artery. More distal runoff shows occlusion of the tibioperoneal trunk with minimal reconstitution of the peroneal and posterior tibial arteries in the distal left leg. The anterior tibial artery is patent proximally but demonstrates short segment occlusion in the mid to distal calf with reconstitution  via muscular collaterals. More distal flow into the foot is not well visualized in part due to the multifocal occlusions. Correlation with Doppler pulses is recommended. NON-VASCULAR Cholelithiasis without complicating factors. Nonobstructing left renal stone as described. Diverticulosis without diverticulitis. Cystic changes in the kidneys bilaterally. These are stable from a prior MRI from October of 2021. Critical Value/emergent results were called by telephone at the time of interpretation on 03/06/2021 at 7:10 pm to JONATHAN CUTHRIELL, PA , who verbally acknowledged these results. Electronically Signed   By: Mark  Lukens M.D.   On: 03/06/2021 19:09   DG Foot Complete Right  Result Date: 03/06/2021 CLINICAL DATA:  Toe necrosis, foot pain EXAM: RIGHT FOOT COMPLETE - 3+ VIEW COMPARISON:  None. FINDINGS: Degenerative changes of the 1st MTP joint with joint space narrowing and spurring. No acute bony abnormality. Specifically, no fracture, subluxation, or dislocation. Plantar calcaneal spur. IMPRESSION: No acute bony abnormality. Electronically Signed   By: Kevin  Dover M.D.   On: 03/06/2021 17:21   Assessment/Plan Yasir Belgard is a 59 year old male with medical history significant for seizure disorder, hypertension and nicotine dependence who presents to the ER for evaluation of concerns for possible clot in his right foot.   1.  Possible Atherosclerotic Disease to the Right Lower Extremity: Patient with multiple risk factors for atherosclerotic disease.  Presents with progressively worsening discomfort and discoloration to the toes on the right foot.  Imaging was notable for multilevel disease. Recommend the patient undergo an angiogram in an attempt to assess anatomy and contributing degree of atherosclerotic disease.  Patient will most likely need kissing stents to the iliac arteries as well intervention distally.  Procedure, risks and benefits were explained to the patient.  All questions were  answered.  The patient wishes to proceed.  We will plan on this tomorrow with Dr. Dew.  2.  Tobacco abuse: We had a discussion for approximately three minutes regarding the absolute need for smoking cessation due to the deleterious nature of tobacco on the vascular system. We discussed the tobacco use would diminish patency of any intervention, and likely significantly worsen progressio of disease. We discussed multiple agents for quitting including replacement therapy or medications to reduce cravings such as Chantix. The patient voices their understanding of the importance of smoking cessation.  3.  Hypertension: On appropriate medications Encouraged good control as its slows the progression of atherosclerotic disease    Discussed with Dr. Dew  Kentrel Clevenger A Marcus Groll, PA-C 03/07/2021 10:41 AM  This note was created with Dragon medical transcription system.  Any error is purely unintentional. 

## 2021-03-08 ENCOUNTER — Encounter
Admission: EM | Disposition: A | Discharge status: Home / Self Care | Payer: Self-pay | Source: Home / Self Care | Attending: Internal Medicine

## 2021-03-08 DIAGNOSIS — I70221 Atherosclerosis of native arteries of extremities with rest pain, right leg: Secondary | ICD-10-CM

## 2021-03-08 DIAGNOSIS — I75021 Atheroembolism of right lower extremity: Secondary | ICD-10-CM

## 2021-03-08 HISTORY — PX: ABDOMINAL AORTOGRAM W/LOWER EXTREMITY: CATH118223

## 2021-03-08 LAB — CBC
HCT: 27.5 % — ABNORMAL LOW (ref 39.0–52.0)
Hemoglobin: 9.8 g/dL — ABNORMAL LOW (ref 13.0–17.0)
MCH: 34.8 pg — ABNORMAL HIGH (ref 26.0–34.0)
MCHC: 35.6 g/dL (ref 30.0–36.0)
MCV: 97.5 fL (ref 80.0–100.0)
Platelets: 247 10*3/uL (ref 150–400)
RBC: 2.82 MIL/uL — ABNORMAL LOW (ref 4.22–5.81)
RDW: 17.5 % — ABNORMAL HIGH (ref 11.5–15.5)
WBC: 6.5 10*3/uL (ref 4.0–10.5)
nRBC: 0 % (ref 0.0–0.2)

## 2021-03-08 LAB — BASIC METABOLIC PANEL
Anion gap: 10 (ref 5–15)
Anion gap: 12 (ref 5–15)
BUN: <5 mg/dL — ABNORMAL LOW (ref 6–20)
BUN: <5 mg/dL — ABNORMAL LOW (ref 6–20)
CO2: 24 mmol/L (ref 22–32)
CO2: 22 mmol/L (ref 22–32)
Calcium: 7.5 mg/dL — ABNORMAL LOW (ref 8.9–10.3)
Calcium: 7.6 mg/dL — ABNORMAL LOW (ref 8.9–10.3)
Chloride: 98 mmol/L (ref 98–111)
Chloride: 97 mmol/L — ABNORMAL LOW (ref 98–111)
Creatinine, Ser: 0.57 mg/dL — ABNORMAL LOW (ref 0.61–1.24)
Creatinine, Ser: 0.6 mg/dL — ABNORMAL LOW (ref 0.61–1.24)
GFR, Estimated: >60 mL/min (ref >60)
GFR, Estimated: >60 mL/min (ref >60)
Glucose, Bld: 82 mg/dL (ref 70–99)
Glucose, Bld: 69 mg/dL — ABNORMAL LOW (ref 70–99)
Potassium: 2.9 mmol/L — ABNORMAL LOW (ref 3.5–5.1)
Potassium: 3 mmol/L — ABNORMAL LOW (ref 3.5–5.1)
Sodium: 132 mmol/L — ABNORMAL LOW (ref 135–145)
Sodium: 131 mmol/L — ABNORMAL LOW (ref 135–145)

## 2021-03-08 LAB — HEPARIN LEVEL (UNFRACTIONATED): Heparin Unfractionated: 0.34 IU/mL (ref 0.30–0.70)

## 2021-03-08 SURGERY — ABDOMINAL AORTOGRAM W/LOWER EXTREMITY
Anesthesia: Moderate Sedation

## 2021-03-08 MED ORDER — DIPHENHYDRAMINE HCL 50 MG/ML IJ SOLN: 50.0000 mg | Freq: Once | INTRAMUSCULAR | Status: DC | PRN

## 2021-03-08 MED ORDER — HYDROMORPHONE HCL 1 MG/ML IJ SOLN: 1.0000 mg | Freq: Once | INTRAMUSCULAR | Status: DC | PRN

## 2021-03-08 MED ORDER — MIDAZOLAM HCL 2 MG/ML PO SYRP
8.0000 mg | ORAL_SOLUTION | Freq: Once | ORAL | Status: DC | PRN
  Filled 2021-03-08: qty 4

## 2021-03-08 MED ORDER — ONDANSETRON HCL 4 MG/2ML IJ SOLN: 4.0000 mg | Freq: Four times a day (QID) | INTRAMUSCULAR | Status: DC | PRN

## 2021-03-08 MED ORDER — FENTANYL CITRATE (PF) 100 MCG/2ML IJ SOLN
INTRAMUSCULAR | Status: AC
  Filled 2021-03-08: qty 2

## 2021-03-08 MED ORDER — METHYLPREDNISOLONE SODIUM SUCC 125 MG IJ SOLR: 125.0000 mg | Freq: Once | INTRAMUSCULAR | Status: DC | PRN

## 2021-03-08 MED ORDER — IODIXANOL 320 MG/ML IV SOLN
INTRAVENOUS | Status: DC | PRN
  Administered 2021-03-08: 30 mL

## 2021-03-08 MED ORDER — HEPARIN SODIUM (PORCINE) 1000 UNIT/ML IJ SOLN
INTRAMUSCULAR | Status: AC
  Filled 2021-03-08: qty 1

## 2021-03-08 MED ORDER — SODIUM CHLORIDE 0.9 % IV SOLN: INTRAVENOUS | Status: DC

## 2021-03-08 MED ORDER — MIDAZOLAM HCL 2 MG/2ML IJ SOLN
INTRAMUSCULAR | Status: DC | PRN
  Administered 2021-03-08: 2 mg via INTRAVENOUS

## 2021-03-08 MED ORDER — POTASSIUM CHLORIDE 10 MEQ/100ML IV SOLN
10.0000 meq | INTRAVENOUS | Status: AC
  Administered 2021-03-08: 10 meq via INTRAVENOUS
  Filled 2021-03-08 (×4): qty 100

## 2021-03-08 MED ORDER — ACETAMINOPHEN 325 MG PO TABS
650.0000 mg | ORAL_TABLET | Freq: Four times a day (QID) | ORAL | Status: DC | PRN
  Filled 2021-03-08: qty 2

## 2021-03-08 MED ORDER — POTASSIUM CHLORIDE CRYS ER 20 MEQ PO TBCR
40.0000 meq | EXTENDED_RELEASE_TABLET | Freq: Once | ORAL | Status: AC
  Administered 2021-03-08: 40 meq via ORAL
  Filled 2021-03-08: qty 2

## 2021-03-08 MED ORDER — ASPIRIN EC 81 MG PO TBEC
81.0000 mg | DELAYED_RELEASE_TABLET | Freq: Every day | ORAL | Status: DC
  Administered 2021-03-08 – 2021-03-09 (×2): 81 mg via ORAL
  Filled 2021-03-08 (×2): qty 1

## 2021-03-08 MED ORDER — FAMOTIDINE 20 MG PO TABS: 40.0000 mg | ORAL_TABLET | Freq: Once | ORAL | Status: DC | PRN

## 2021-03-08 MED ORDER — ACETAMINOPHEN 650 MG RE SUPP: 650.0000 mg | Freq: Four times a day (QID) | RECTAL | Status: DC | PRN

## 2021-03-08 MED ORDER — APIXABAN 5 MG PO TABS
5.0000 mg | ORAL_TABLET | Freq: Two times a day (BID) | ORAL | Status: DC
  Administered 2021-03-08 – 2021-03-09 (×2): 5 mg via ORAL
  Filled 2021-03-08 (×3): qty 1

## 2021-03-08 MED ORDER — HEPARIN SODIUM (PORCINE) 1000 UNIT/ML IJ SOLN
INTRAMUSCULAR | Status: DC | PRN
  Administered 2021-03-08: 3000 [IU] via INTRAVENOUS

## 2021-03-08 MED ORDER — OXYCODONE-ACETAMINOPHEN 5-325 MG PO TABS
1.0000 | ORAL_TABLET | Freq: Four times a day (QID) | ORAL | Status: DC | PRN
  Administered 2021-03-08 – 2021-03-09 (×2): 1 via ORAL
  Filled 2021-03-08 (×2): qty 1

## 2021-03-08 MED ORDER — MIDAZOLAM HCL 5 MG/5ML IJ SOLN
INTRAMUSCULAR | Status: AC
  Filled 2021-03-08: qty 5

## 2021-03-08 MED ORDER — FENTANYL CITRATE (PF) 100 MCG/2ML IJ SOLN
INTRAMUSCULAR | Status: DC | PRN
  Administered 2021-03-08: 50 ug via INTRAVENOUS

## 2021-03-08 SURGICAL SUPPLY — 13 items
CATH ANGIO 5F PIGTAIL 65CM (CATHETERS) ×1 IMPLANT
COVER PROBE U/S 5X48 (MISCELLANEOUS) ×1 IMPLANT
DEVICE SAFEGUARD 24CM (GAUZE/BANDAGES/DRESSINGS) ×1 IMPLANT
DEVICE STARCLOSE SE CLOSURE (Vascular Products) ×1 IMPLANT
GLIDEWIRE ADV .035X260CM (WIRE) ×1 IMPLANT
KIT ENCORE 26 ADVANTAGE (KITS) ×1 IMPLANT
PACK ANGIOGRAPHY (CUSTOM PROCEDURE TRAY) ×1 IMPLANT
SHEATH BRITE TIP 5FRX11 (SHEATH) ×1 IMPLANT
SHEATH BRITE TIP 6FR X 23 (SHEATH) ×1 IMPLANT
STENT LIFESTREAM 6X37X80 (Permanent Stent) ×1 IMPLANT
SYR MEDRAD MARK 7 150ML (SYRINGE) ×1 IMPLANT
TUBING CONTRAST HIGH PRESS 48 (TUBING) ×2 IMPLANT
WIRE GUIDERIGHT .035X150 (WIRE) ×2 IMPLANT

## 2021-03-08 NOTE — Progress Notes (Signed)
ANTICOAGULATION CONSULT NOTE  Pharmacy Consult for heparin infusion Indication: arterial occlusion  No Known Allergies  Patient Measurements: Height: 5\' 7"  (170.2 cm) Weight: 48.2 kg (106 lb 3.2 oz) IBW/kg (Calculated) : 66.1 Heparin Dosing Weight: 47.6 kg  Vital Signs: Temp: 98 F (36.7 C) (06/03 0447) Temp Source: Oral (06/02 2028) BP: 122/97 (06/03 0447) Pulse Rate: 58 (06/03 0447)  Labs: Recent Labs    03/06/21 1619 03/07/21 0200 03/07/21 0328 03/07/21 1021 03/07/21 1716 03/08/21 0449  HGB 11.0*  --  8.9*  --   --  9.8*  HCT 31.6*  --  26.1*  --   --  27.5*  PLT 272  --  217  --   --  247  LABPROT 12.6  --   --   --   --   --   INR 0.9  --   --   --   --   --   HEPARINUNFRC  --    < >  --  0.50 0.51 0.34  CREATININE 0.76  --  0.67  --   --   --    < > = values in this interval not displayed.    Estimated Creatinine Clearance: 68.6 mL/min (by C-G formula based on SCr of 0.67 mg/dL).   Medical History: Past Medical History:  Diagnosis Date  . Hypertension   . Scoliosis   . Seizures ([name])     Medications:  Per chart review and spoke with nurse who verified with pt that he does not take any anticoagulation at home.   Assessment: 60 y.o. male who presented to the emergency department concern for a "clot" in his foot as he had seen a black spot to the middle toe of the right foot. Pt has no history of DVT or PE. Pharmacy has been consulted for heparin dosing and monitoring for arterial occlusion.   Baseline labs: PT 12.6, INR 0.9, Hgb 11.0, Hct 31.6, Plt 272  Goal of Therapy:  Heparin level 0.3-0.7 units/ml Monitor platelets by anticoagulation protocol: Yes  6/02 0200 HL 0.15 6/02 1021 HL 0.50 6/02 1716 HL 0.51 6/03 0449 HL 0.34   Plan:   Heparin level therapeutic  Continue heparin infusion at 950 units/hr  Recheck HL with AM labs  Continue to monitor H&H and platelets  8/03, PharmD, [facility] 03/08/2021 5:39 AM

## 2021-03-08 NOTE — Progress Notes (Signed)
Patient ID: Albert [name], male   DOB: 1960-12-04, 60 y.o.   MRN: [id number]  Left message for brother [name] per pt's request

## 2021-03-08 NOTE — Consult Note (Signed)
ANTICOAGULATION CONSULT NOTE - Initial Consult  Pharmacy Consult for apixaban  Indication: Aortic mural thrombus  No Known Allergies  Patient Measurements: Height: 5\' 7"  (170.2 cm) Weight: 48.2 kg (106 lb 3.2 oz) IBW/kg (Calculated) : 66.1  Vital Signs: Temp: 97.6 F (36.4 C) (06/03 1603) Temp Source: Oral (06/03 0756) BP: 129/70 (06/03 1603) Pulse Rate: 57 (06/03 1603)  Labs: Recent Labs    03/06/21 1619 03/07/21 0200 03/07/21 0328 03/07/21 1021 03/07/21 1716 03/08/21 0449 03/08/21 1031  HGB 11.0*  --  8.9*  --   --  9.8*  --   HCT 31.6*  --  26.1*  --   --  27.5*  --   PLT 272  --  217  --   --  247  --   LABPROT 12.6  --   --   --   --   --   --   INR 0.9  --   --   --   --   --   --   HEPARINUNFRC  --    < >  --  0.50 0.51 0.34  --   CREATININE 0.76  --  0.67  --   --  0.57* 0.60*   < > = values in this interval not displayed.    Estimated Creatinine Clearance: 68.6 mL/min (A) (by C-G formula based on SCr of 0.6 mg/dL (L)).   Medical History: Past Medical History:  Diagnosis Date  . Hypertension   . Scoliosis   . Seizures ([name])     Medications:  IV heparin discontinued 03/08/21 @ 1555  Assessment: 59 y.o.malewho presented to the emergency department on 03/06/21 with concern for a "clot" in his foot as he had seen a black spot to the middle toe of the right foot. Imaging showing significant mural thrombus within the infrarenal abdominal aorta with subsequent occlusion of the left common iliac artery. Patient s/p re-vascularization w/ stent placement 03/08/21. Pharmacy has been consulted to transition from heparin to apixaban.    Goal of Therapy:  Prevent further clot formation/burden Monitor platelets by anticoagulation protocol: Yes   Plan:   Discontinue heparin infusion  Initiate apixaban 5 mg BID   Monitor CBC per protocol   05/08/21, PharmD, BCPS Clinical Pharmacist  03/08/2021,4:36 PM

## 2021-03-08 NOTE — Progress Notes (Signed)
[facility]  - [place] [facility]   PATIENT NAME: Albert [name]    MR#:  [id number]  DATE OF BIRTH:  Mar 30, 1961  SUBJECTIVE:  right foot pain. Patient is status post right lower extremity angiogram  REVIEW OF SYSTEMS:   Review of Systems  Constitutional: Negative for chills, fever and weight loss.  HENT: Negative for ear discharge, ear pain and nosebleeds.   Eyes: Negative for blurred vision, pain and discharge.  Respiratory: Negative for sputum production, shortness of breath, wheezing and stridor.   Cardiovascular: Negative for chest pain, palpitations, orthopnea and PND.  Gastrointestinal: Negative for abdominal pain, diarrhea, nausea and vomiting.  Genitourinary: Negative for frequency and urgency.  Musculoskeletal: Positive for joint pain. Negative for back pain.  Neurological: Negative for sensory change, speech change, focal weakness and weakness.  Psychiatric/Behavioral: Negative for depression and hallucinations. The patient is not nervous/anxious.    Tolerating Diet:yes Tolerating PT:   DRUG ALLERGIES:  No Known Allergies  VITALS:  Blood pressure 127/69, pulse 60, temperature 99 F (37.2 C), resp. rate 20, height 5\' 7"  (1.702 m), weight 48.2 kg, SpO2 100 %.  PHYSICAL EXAMINATION:   Physical Exam  GENERAL:  60 y.o.-year-old patient lying in the bed with no acute distress.  LUNGS: Normal breath sounds bilaterally, no wheezing, rales, rhonchi. No use of accessory muscles of respiration.  CARDIOVASCULAR: S1, S2 normal. No murmurs, rubs, or gallops.  ABDOMEN: Soft, nontender, nondistended. Bowel sounds present. No organomegaly or mass.  EXTREMITIES:   .    NEUROLOGIC:nonfocal   PSYCHIATRIC:  patient is alert and oriented x 3.  SKIN: No obvious rash, lesion, or ulcer.   LABORATORY PANEL:  CBC Recent Labs  Lab 03/08/21 0449  WBC 6.5  HGB 9.8*  HCT 27.5*  PLT 247    Chemistries  Recent Labs  Lab 03/06/21 1619 03/07/21 0200  03/07/21 0328 03/08/21 1031  NA 135  --    < > 131*  K 2.8*  --    < > 3.0*  CL 94*  --    < > 97*  CO2 30  --    < > 22  GLUCOSE 115*  --    < > 69*  BUN 6  --    < > <5*  CREATININE 0.76  --    < > 0.60*  CALCIUM 8.8*  --    < > 7.6*  MG  --  1.5*  --   --   AST 27  --   --   --   ALT 10  --   --   --   ALKPHOS 119  --   --   --   BILITOT 0.8  --   --   --    < > = values in this interval not displayed.   Cardiac Enzymes No results for input(s): TROPONINI in the last 168 hours. RADIOLOGY:  CT Angio Aortobifemoral W and/or Wo Contrast  Result Date: 03/06/2021 CLINICAL DATA:  Discoloration of the right fourth toe with right foot pain EXAM: CT ANGIOGRAPHY OF ABDOMINAL AORTA WITH ILIOFEMORAL RUNOFF TECHNIQUE: Multidetector CT imaging of the abdomen, pelvis and lower extremities was performed using the standard protocol during bolus administration of intravenous contrast. Multiplanar CT image reconstructions and MIPs were obtained to evaluate the vascular anatomy. CONTRAST:  05/06/2021 OMNIPAQUE IOHEXOL 350 MG/ML SOLN COMPARISON:  None. FINDINGS: VASCULAR Aorta: Atherosclerotic calcifications are noted with considerable mural thrombus within the infrarenal aorta. Celiac: Patent without evidence of  aneurysm, dissection, vasculitis or significant stenosis. SMA: Patent without evidence of aneurysm, dissection, vasculitis or significant stenosis. Renals: Both renal arteries are patent without evidence of aneurysm, dissection, vasculitis, fibromuscular dysplasia or significant stenosis. IMA: Patent without evidence of aneurysm, dissection, vasculitis or significant stenosis. RIGHT Lower Extremity Inflow: Atherosclerotic calcifications are noted within the common and external iliac arteries on the right. Irregular filling defect is noted at the level of the iliac bifurcation with occlusion of the right internal iliac artery at its origin with reconstitution via multiple muscular collaterals in the deeper  pelvis. Additionally there is high-grade narrowing of the distal aspect of the common iliac artery extending into the external iliac artery. The external iliac artery is within normal limits. Runoff: Superficial femoral artery is widely patent. Atherosclerotic changes in the proximal and mid popliteal artery are noted. Popliteal trifurcation is patent with 2 vessel runoff to the level of the right foot. Digital arteries are noted extending into the distal aspect of the foot without occlusive change. LEFT Lower Extremity Inflow: There is occlusion of the left common iliac artery at its origin related to the mural thrombus in the distal aorta. Reconstitution of the proximal aspect left internal iliac artery is noted although multiple filling defects are noted within consistent with thrombi/emboli. Reconstitution of the common femoral artery is noted primarily via the inferior epigastric artery on the left as well as multiple muscular collaterals to include the deep circumflex iliac artery on the left. Runoff: Common femoral bifurcation is patent although a tubular filling defect is noted in the proximal aspect of the left superficial femoral artery consistent with thrombus. This is attached at the level of the common femoral artery. Distal superficial femoral artery is within normal limits. Popliteal artery is patent as well. The popliteal trifurcation however shows only patency of the anterior tibial artery proximally. Some segmental reconstitution of the peroneal artery is seen. Mid calf occlusion of the anterior tibial artery is noted with some distal reconstitution via muscular collaterals. Mild reconstitution of the distal aspect of the posterior tibial artery is noted as well. Evaluation of the foot is limited due to the multiple areas of occlusion. Veins: No specific venous abnormality is noted. Review of the MIP images confirms the above findings. NON-VASCULAR Lower chest: No acute abnormality. Hepatobiliary:  Mild dependent gallstones are seen without complicating factors. The liver is unremarkable. Pancreas: Unremarkable. No pancreatic ductal dilatation or surrounding inflammatory changes. Spleen: Normal in size without focal abnormality. Adrenals/Urinary Tract: Adrenal glands are mildly hypertrophied without focal mass. Kidneys demonstrate scattered hypodensities likely related to renal cyst but incompletely evaluated this exam. 7 mm nonobstructing stone is noted in the midportion of the left kidney. The ureters are within normal limits. Bladder is partially distended. Stomach/Bowel: Scattered diverticular change of the colon is noted without evidence of diverticulitis. The appendix is within normal limits. Small bowel and stomach are unremarkable. Lymphatic: No significant lymphadenopathy is noted. Reproductive: Prostate is within normal limits. Other: No abdominal wall hernia or abnormality. No abdominopelvic ascites. Musculoskeletal: No acute bony abnormality is noted. 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 Irregular filling defect within the distal common iliac artery on the right extending into the proximal aspect of the external iliac artery on the right with high-grade stenosis identified. Occlusion of the right internal iliac artery at its origin is noted secondary to this filling defect. This is highly suspicious for underlying embolus. Runoff  on the right shows atherosclerotic disease although 2 vessel runoff to the level of the right foot and subsequently into the foot is identified. The left common iliac artery occlusion extends to the level of the common femoral artery with significant reconstitution via the deep circumflex iliac artery and inferior epigastric arteries on the left. Some reconstitution of the distal aspect of the left internal iliac artery is noted although multifocal  filling defects and occlusive changes are seen again highly suspicious for emboli. Runoff on the left shows a tubular filling defect within the proximal superficial femoral artery which appears attached to the arterial wall in the distal common iliac artery. More distal runoff shows occlusion of the tibioperoneal trunk with minimal reconstitution of the peroneal and posterior tibial arteries in the distal left leg. The anterior tibial artery is patent proximally but demonstrates short segment occlusion in the mid to distal calf with reconstitution via muscular collaterals. More distal flow into the foot is not well visualized in part due to the multifocal occlusions. Correlation with Doppler pulses is recommended. NON-VASCULAR Cholelithiasis without complicating factors. Nonobstructing left renal stone as described. Diverticulosis without diverticulitis. Cystic changes in the kidneys bilaterally. These are stable from a prior MRI from October of 2021. Critical Value/emergent results were called by telephone at the time of interpretation on 03/06/2021 at 7:10 pm to [facility] , who verbally acknowledged these results. Electronically Signed   By: Alcide Clever M.D.   On: 03/06/2021 19:09   PERIPHERAL VASCULAR CATHETERIZATION  Result Date: 03/08/2021 See op note  DG Foot Complete Right  Result Date: 03/06/2021 CLINICAL DATA:  Toe necrosis, foot pain EXAM: RIGHT FOOT COMPLETE - 3+ VIEW COMPARISON:  None. FINDINGS: Degenerative changes of the 1st MTP joint with joint space narrowing and spurring. No acute bony abnormality. Specifically, no fracture, subluxation, or dislocation. Plantar calcaneal spur. IMPRESSION: No acute bony abnormality. Electronically Signed   By: Charlett Nose M.D.   On: 03/06/2021 17:21   ASSESSMENT AND PLAN:  Amalio Prevatteis a 59 year oldmalewith medical history significant forseizure disorder, hypertension and nicotine dependence who presents to the ER for evaluation of  concerns for possible clot in his right foot. Patient states he noted a black discoloration over the fourth toe on his right foot as well as some discoloration of the fifth toe.  Atherosclerotic disease right lower extremity Right fourth toe dry gangrene -- CT angiogram shows multilevel peripheral vascular disease. -- Currently on IV heparin drip--now d/ced --Per Dr Wyn Quaker-- start aspirin and eliquis -- s/p right lower extremity angiogram :Stent placement to the distal right common iliac and proximal right external iliac artery with a 6 mm diameter by 37 mm length lifestream stent -- patient will need to closely follow-up with vascular as outpatient for further workup  Nicotine abuse -- patient recommended smoking cessation -- continue nicotine patch  Hypertension started on amlodipine   Procedures: right lower extremity angiogram Family communication : none Consults : vascular CODE STATUS: full DVT Prophylaxis : Level of care: Progressive Cardiac Status is: Inpatient  Remains inpatient appropriate because:Inpatient level of care appropriate due to severity of illness   Dispo: The patient is from: Home              Anticipated d/c is to: Home              Patient currently is not medically stable to d/c.  Will continue to monitor tonight if remains stable discharge tomorrow  TOTAL TIME TAKING CARE OF THIS PATIENT: *25* minutes.  >50% time spent on counselling and coordination of care  Note: This dictation was prepared with Dragon dictation along with smaller phrase technology. Any transcriptional errors that result from this process are unintentional.  Enedina Finner M.D    [facility]   CC: Primary care physician; [facility], IncPatient ID: Cristela Blue, male   DOB: 01/12/1961, 60 y.o.   MRN: [id number]

## 2021-03-08 NOTE — Progress Notes (Signed)
Report called to Ke[place] RN.  All questions answered.

## 2021-03-08 NOTE — Progress Notes (Signed)
Pt unable to tolerate IV K+/ states" it burns too bad" / IV site checked - WNL/  Pharmacy and MD made aware/ additional PO dose ordered and given .

## 2021-03-08 NOTE — Progress Notes (Signed)
Initial Nutrition Assessment  DOCUMENTATION CODES:  Underweight  INTERVENTION:   Advance to regular diet after procedure  Ensure Enlive po TID, each supplement provides 350 kcal and 20 grams of protein  MVI with minerals daily  Magic cup TID with meals, each supplement provides 290 kcal and 9 grams of protein  NUTRITION DIAGNOSIS:  Inadequate oral intake related to  (unable/unwilling to consume adequate kcal to maintain a healthy weight) as evidenced by  (BMI of 16.63).  GOAL:  Patient will meet greater than or equal to 90% of their needs  MONITOR:  PO intake,Supplement acceptance,Labs,Weight trends,Skin  REASON FOR ASSESSMENT:  Other (Comment) (low BMI)    ASSESSMENT:  Pt presented to ED with concerns over discoloration to his right foot/toes. Imaging in ED revealed significant atherosclerotic disease to the right lower extremity. PMH relevant for HTN and tobacco abuse.  Pt out of room for procedure at the time of visit. Undergoing angiogram today with vascular surgery to attempt to assess anatomy and contributing degree of atherosclerotic disease. Pt significantly underweight and blood glucose low at times. Would recommend a regular diet after surgery with nutrition supplements between meals to encourage adequate nutrition and weight gain.  Average Meal Intake: . 6/1-6/3: 25% intake x 1 recorded meal  Nutritionally Relevant Medications: Scheduled Meds: . atorvastatin  80 mg Oral Daily   Continuous Infusions: . sodium chloride 75 mL/hr at 03/08/21 0059  . potassium chloride 10 mEq (03/08/21 1016)   PRN Meds: diphenhydrAMINE, famotidine, methylPREDNISolone injection, ondansetron  Labs reviewed:  Na 131 / Chloride 97  K 3.0  BUN <5, creatinine .6  SBG ranges from 62-90 mg/dL over the last 24 hours  NUTRITION - FOCUSED PHYSICAL EXAM: Pt out of room, defer to follow-up  Diet Order:   Diet Order            Diet NPO time specified  Diet effective midnight                 EDUCATION NEEDS:  No education needs have been identified at this time  Skin:  Skin Assessment: Reviewed RN Assessment  Last BM:  6/2 per RN documentation  Height:  Ht Readings from Last 1 Encounters:  03/06/21 5\' 7"  (1.702 m)    Weight:  Wt Readings from Last 1 Encounters:  03/08/21 48.2 kg    Ideal Body Weight:  67.3 kg  BMI:  Body mass index is 16.63 kg/m.  Estimated Nutritional Needs:   Kcal:  1600-1800 kcal/d  Protein:  90-100g/d  Fluid:  >1889mL/d   80m, RD, LDN Clinical Dietitian Pager on Amion

## 2021-03-08 NOTE — Op Note (Signed)
[facility]  Percutaneous Study/Intervention Procedural Note   Date of Surgery: 03/08/2021  Surgeon(s):Odell Fasching    Assistants:none  Pre-operative Diagnosis: PAD with rest [name] and atheroembolization to the right foot  Post-operative diagnosis:  Same  Procedure(s) Performed:             1.  Ultrasound guidance for vascular access right femoral artery             2.  Catheter placement into aorta from right femoral approach             3.  Aortogram and selective right lower extremity angiogram             4.   Stent placement to the distal right common iliac and proximal right external iliac artery with a 6 mm diameter by 37 mm length lifestream stent             5.   StarClose closure device right femoral artery  EBL: 5 cc  Contrast: 30 cc  Fluoro Time: 1.5 minutes  Moderate Conscious Sedation Time: approximately 33 minutes using 2 mg of Versed and 50 mcg of Fentanyl              Indications:  Patient is a 60 y.o.male with atheroembolization rest [name] to the right foot. The patient is brought in for angiography for further evaluation and potential treatment.  Due to the limb threatening nature of the situation, angiogram was performed for attempted limb salvage. The patient is aware that if the procedure fails, amputation would be expected.  The patient also understands that even with successful revascularization, amputation may still be required due to the severity of the situation.  Risks and benefits are discussed and informed consent is obtained.   Procedure:  The patient was identified and appropriate procedural time out was performed.  The patient was then placed supine on the table and prepped and draped in the usual sterile fashion. Moderate conscious sedation was administered during a face to face encounter with the patient throughout the procedure with my supervision of the RN administering medicines and monitoring the patient's vital signs, pulse  oximetry, telemetry and mental status throughout from the start of the procedure until the patient was taken to the recovery room. Ultrasound was used to evaluate the right common femoral artery.  It was patent .  A digital ultrasound image was acquired.  A Seldinger needle was used to access the right common femoral artery under direct ultrasound guidance and a permanent image was performed.  A 0.035 J wire was advanced without resistance and a 5Fr sheath was placed.  Pigtail catheter was placed into the aorta and an AP aortogram was performed. The aorta was heavily diseased with a large amount of thrombus starting just below the right renal artery and above the left renal artery creating greater than 70% stenosis and an aorta which had a very small flow lumen.  The left iliac artery was occluded at its origin and there was reconstitution of the left femoral head.  The distal right common iliac artery and proximal right external iliac artery had a very high-grade stenosis in the 90% range.  The right internal iliac artery was occluded.  The right common femoral artery was fairly normal. I then imaged the right lower extremity.  This demonstrated very mild disease in the right distal SFA and above-knee popliteal artery which was nonflow limiting.  There was two-vessel runoff distally.  His infrainguinal flow was  good but he had the aforementioned inflow issue.  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  The disease progressed up to actually above the left renal artery so open surgical therapy is likely going to be needed at some point.  The high-grade lesion in the right iliac artery was likely responsible for the distal embolization as well as limiting inflow so I elected to treat this area today.  A small dose of heparin was given and primarily stented this with a 6 mm diameter by 37 mm lifestream stent in the distal right common and proximal right external iliac artery.  This  inflated to 12 atm after we upsized to a 6 [place] sheath.  Lesion imaging showed less than 10% residual stenosis.  I elected to terminate the procedure. The sheath was removed and StarClose closure device was deployed in the right femoral artery with excellent hemostatic result. The patient was taken to the recovery room in stable condition having tolerated the procedure well.  Findings:               Aortogram:  The aorta was heavily diseased with a large amount of thrombus starting just below the right renal artery and above the left renal artery creating greater than 70% stenosis and an aorta which had a very small flow lumen.  The left iliac artery was occluded at its origin and there was reconstitution of the left femoral head.  The distal right common iliac artery and proximal right external iliac artery had a very high-grade stenosis in the 90% range.  The right internal iliac artery was occluded.  The right common femoral artery was fairly normal.             Right Lower Extremity:  This demonstrated very mild disease in the right distal SFA and above-knee popliteal artery which was nonflow limiting.  There was two-vessel runoff distally.   Disposition: Patient was taken to the recovery room in stable condition having tolerated the procedure well.  Complications: None  Albert [name] 03/08/2021 2:47 PM   This note was created with Dragon Medical transcription system. Any errors in dictation are purely unintentional.

## 2021-03-08 NOTE — Interval H&P Note (Signed)
History and Physical Interval Note:  03/08/2021 12:50 PM  Albert [name]  has presented today for surgery, with the diagnosis of Atherosclerotic Disease With Gangrene.  The various methods of treatment have been discussed with the patient and family. After consideration of risks, benefits and other options for treatment, the patient has consented to  Procedure(s): ABDOMINAL AORTOGRAM W/LOWER EXTREMITY (N/A) as a surgical intervention.  The patient's history has been reviewed, patient examined, no change in status, stable for surgery.  I have reviewed the patient's chart and labs.  Questions were answered to the patient's satisfaction.     Festus Barren

## 2021-03-09 ENCOUNTER — Encounter: Payer: Self-pay | Admitting: Internal Medicine

## 2021-03-09 DIAGNOSIS — I7410 Embolism and thrombosis of unspecified parts of aorta: Secondary | ICD-10-CM

## 2021-03-09 LAB — CBC
HCT: 29.6 % — ABNORMAL LOW (ref 39.0–52.0)
Hemoglobin: 10.3 g/dL — ABNORMAL LOW (ref 13.0–17.0)
MCH: 34.3 pg — ABNORMAL HIGH (ref 26.0–34.0)
MCHC: 34.8 g/dL (ref 30.0–36.0)
MCV: 98.7 fL (ref 80.0–100.0)
Platelets: 246 10*3/uL (ref 150–400)
RBC: 3 MIL/uL — ABNORMAL LOW (ref 4.22–5.81)
RDW: 17.4 % — ABNORMAL HIGH (ref 11.5–15.5)
WBC: 5.7 10*3/uL (ref 4.0–10.5)
nRBC: 0 % (ref 0.0–0.2)

## 2021-03-09 LAB — BASIC METABOLIC PANEL
Anion gap: 7 (ref 5–15)
BUN: 5 mg/dL — ABNORMAL LOW (ref 6–20)
CO2: 23 mmol/L (ref 22–32)
Calcium: 8 mg/dL — ABNORMAL LOW (ref 8.9–10.3)
Chloride: 104 mmol/L (ref 98–111)
Creatinine, Ser: 0.61 mg/dL (ref 0.61–1.24)
GFR, Estimated: >60 mL/min (ref >60)
Glucose, Bld: 92 mg/dL (ref 70–99)
Potassium: 3.8 mmol/L (ref 3.5–5.1)
Sodium: 134 mmol/L — ABNORMAL LOW (ref 135–145)

## 2021-03-09 MED ORDER — OXYCODONE-ACETAMINOPHEN 5-325 MG PO TABS: 1.0000 | ORAL_TABLET | Freq: Three times a day (TID) | ORAL | 0 refills | Status: DC | PRN

## 2021-03-09 MED ORDER — ATORVASTATIN CALCIUM 20 MG PO TABS: 80.0000 mg | ORAL_TABLET | Freq: Every day | ORAL | 3 refills | Status: DC

## 2021-03-09 MED ORDER — NICOTINE 21 MG/24HR TD PT24: 21.0000 mg | MEDICATED_PATCH | Freq: Every day | TRANSDERMAL | 0 refills | Status: DC

## 2021-03-09 MED ORDER — APIXABAN 5 MG PO TABS: 5.0000 mg | ORAL_TABLET | Freq: Two times a day (BID) | ORAL | 3 refills | Status: DC

## 2021-03-09 MED ORDER — AMLODIPINE BESYLATE 5 MG PO TABS: 5.0000 mg | ORAL_TABLET | Freq: Every day | ORAL | 3 refills | Status: AC

## 2021-03-09 MED ORDER — ASPIRIN 81 MG PO TBEC: 81.0000 mg | DELAYED_RELEASE_TABLET | Freq: Every day | ORAL | 11 refills | Status: DC

## 2021-03-09 NOTE — Plan of Care (Signed)
  Problem: Education: Goal: Knowledge of General Education information will improve Description: Including pain rating scale, medication(s)/side effects and non-pharmacologic comfort measures Outcome: Progressing   Problem: Health Behavior/Discharge Planning: Goal: Ability to manage health-related needs will improve Outcome: Progressing   Problem: Clinical Measurements: Goal: Ability to maintain clinical measurements within normal limits will improve Outcome: Progressing Goal: Cardiovascular complication will be avoided Outcome: Progressing   Problem: Activity: Goal: Risk for activity intolerance will decrease Outcome: Progressing   Problem: Nutrition: Goal: Adequate nutrition will be maintained Outcome: Progressing   Problem: Coping: Goal: Level of anxiety will decrease Outcome: Progressing   

## 2021-03-09 NOTE — Progress Notes (Signed)
Pt given d/c education, all questions answered. IV's removed. Pt has percocet script for home and aware of other scripts at pharmacy. Pt has nicotine patch to R arm and is aware. Pt has all belongings. Volunteers to take out to friend's/family/s car who are to pick him up.

## 2021-03-09 NOTE — Progress Notes (Signed)
Received report from nurse pt had a angiogram thrrough a right femoral approach today with stent placement and has a PAD device in place which she states is suppose to be take down midday tomorrow.

## 2021-03-09 NOTE — Progress Notes (Signed)
Patient arrived on floor made comfortable in bed.  PAD device noted to R groin same in place. Nil neurovascular deficit note to extremity.

## 2021-03-09 NOTE — Discharge Instructions (Signed)
Atherosclerosis  Atherosclerosis is narrowing and hardening of the arteries. Arteries are blood vessels that carry blood from the heart to all parts of the body. This blood contains oxygen. Arteries can become narrow or blocked from inflammation or from a buildup of fat, cholesterol, calcium, and other substances (plaque). Plaque decreases the amount of blood that can flow through the artery. Atherosclerosis can affect any artery in your body, including:  Heart arteries. Damage to these arteries may lead to coronary artery disease, which can cause a heart attack.  Brain arteries. Damage to these arteries may cause a stroke.  Leg, arm, and pelvis arteries. Peripheral artery disease (PAD) may result from damage to these arteries.  Kidney arteries. Kidney (renal) failure may result from damage to kidney arteries. Treatment may slow the disease and prevent further damage to your heart, brain, peripheral arteries, and kidneys. What are the causes? This condition develops slowly over many years. The inner layers of your arteries become damaged and allow the gradual buildup of plaque. The exact cause of atherosclerosis is not fully understood. Symptoms of atherosclerosis do not occur until an artery becomes narrow or blocked. What increases the risk? The following factors may make you more likely to develop this condition:  Being middle-aged or older.  Certain medical conditions, including: ? High blood pressure. ? High cholesterol. ? High blood fats (triglycerides). ? Diabetes. ? Sleep apnea.  A family history of atherosclerosis.  Being overweight.  Using products that contain tobacco or nicotine.  Not exercising enough (sedentary lifestyle).  Having a substance in your blood called C-reactive protein (CRP). This is a sign of increased levels of inflammation in your body.  Being stressed.  Drinking too much alcohol or using drugs, such as cocaine or methamphetamine. What are the  signs or symptoms? Symptoms of atherosclerosis may not be obvious until there is damage to an area of your body that is not getting enough blood. Sometimes, atherosclerosis does not cause symptoms. Symptoms of this condition include:  Coronary artery disease. This may cause chest pain and shortness of breath.  Decreased blood supply to your brain, which may cause a stroke. Signs of a stroke may include sudden: ? Weakness or numbness in your face, arm, or leg, especially on one side of your body. ? Trouble walking or difficulty moving your arms or legs. ? Loss of balance or coordination. ? Confusion. ? Slurred speech (dysarthria). ? Trouble speaking, or trouble understanding speech, or both (aphasia). ? Vision changes in one or both eyes. This may be double vision, blurred vision, or loss of vision. ? Severe headache with no known cause. The headache is often described as the worst headache ever experienced.  PAD, which may cause pain and numbness, often in your legs and hips.  Renal failure. This may cause tiredness, problems with urination, swelling, and itchy skin. How is this diagnosed? This condition is diagnosed based on your medical history and a physical exam. During the exam, your health care provider will:  Check your pulse in different places.  Listen for a "whooshing" sound over your arteries (bruit). You may also have tests, such as:  Blood tests to check your levels of cholesterol, triglycerides, and CRP.  Electrocardiogram (ECG) to check for heart damage.  Chest X-ray to see if you have an enlarged heart, which is a sign of heart failure.  Stress test to see how your heart reacts to exercise.  Echocardiogram to get images of the inside of your heart.  Ankle-brachial  index to compare blood pressure in your arms to blood pressure in your ankles.  Ultrasound of your peripheral arteries to check blood flow.  CT scan to check for damage to your heart or  brain.  X-rays of blood vessels after dye has been injected (angiogram) to check blood flow. How is this treated? This condition is treated with lifestyle changes as the first step. These may include:  Changing your diet.  Losing weight.  Reducing stress.  Exercising and being physically active more regularly.  Quitting smoking. You may also need medicine to:  Lower triglycerides and cholesterol.  Control blood pressure.  Prevent blood clots.  Lower inflammation in your body.  Control your blood sugar. Sometimes, surgery is needed to:  Remove plaque from an artery (endarterectomy).  Open or widen a narrowed heart artery (angioplasty).  Create a new path for your blood with one of these procedures: ? Heart (coronary) artery bypass graft surgery. ? Peripheral artery bypass graft surgery. Follow these instructions at home: Eating and drinking  Eat a heart-healthy diet. Talk with your health care provider or a diet and nutrition specialist (dietitian) if you need help. A heart-healthy diet involves: ? Limiting unhealthy fats and increasing healthy fats. Some examples of healthy fats are olive oil and canola oil. ? Eating plant-based foods, such as fruits, vegetables, nuts, whole grains, and legumes (such as peas and lentils).  If you drink alcohol: ? Limit how much you use to:  0-1 drink a day for nonpregnant women.  0-2 drinks a day for men. ? Be aware of how much alcohol is in your drink. In the [place] one drink equals one 12 oz bottle of beer (355 mL), one 5 oz glass of wine (148 mL), or one 1 oz glass of hard liquor (44 mL).   Lifestyle  Maintain a healthy weight. Lose weight if your health care provider says that you need to do that.  Follow an exercise program as told by your health care provider.  Do not use any products that contain nicotine or tobacco, such as cigarettes, e-cigarettes, and chewing tobacco. If you need help quitting, ask your health care  provider.  Do not use drugs.   General instructions  Take over-the-counter and prescription medicines only as told by your health care provider.  Manage other health conditions as told.  Keep all follow-up visits as told by your health care provider. This is important. Contact a health care provider if you have:  An irregular heartbeat.  Unexplained fatigue.  Trouble urinating, or you are producing less urine or foamy urine.  Swelling of your hands or feet, or itchy skin.  Unexplained pain or numbness in your legs or hips. Get help right away if:  You have any symptoms of a heart attack. These may be: ? Chest pain. This includes squeezing chest pain that may feel like indigestion (angina). ? Shortness of breath. ? Pain in your neck, jaw, arms, back, or stomach. ? Cold sweat. ? Nausea. ? Light-headedness.  You have any symptoms of a stroke. "BE FAST" is an easy way to remember the main warning signs of a stroke: ? B - Balance. Signs are dizziness, sudden trouble walking, or loss of balance. ? E - Eyes. Signs are trouble seeing or a sudden change in vision. ? F - Face. Signs are sudden weakness or numbness of the face, or the face or eyelid drooping on one side. ? A - Arms. Signs are weakness or numbness in an  arm. This happens suddenly and usually on one side of the body. ? S - Speech. Signs are sudden trouble speaking, slurred speech, or trouble understanding what people say. ? T - Time. Time to call emergency services. Write down what time symptoms started.  You have other signs of a stroke, such as: ? A sudden, severe headache with no known cause. ? Nausea or vomiting. ? Seizure. These symptoms may represent a serious problem that is an emergency. Do not wait to see if the symptoms will go away. Get medical help right away. Call your local emergency services (911 in the [place] Do not drive yourself to the hospital. Summary  Atherosclerosis is narrowing and hardening of  the arteries.  Arteries can become narrow from inflammation or from a buildup of fat, cholesterol, calcium, and other substances (plaque).  This condition may not cause any symptoms. If you do have symptoms, they are caused by damage to an area of your body that is not getting enough blood.  Treatment starts with lifestyle changes and may include medicines. In some cases, surgery is needed.  Get help right away if you have any symptoms of a heart attack or stroke. This information is not intended to replace advice given to you by your health care provider. Make sure you discuss any questions you have with your health care provider. Document Revised: 08/01/2019 Document Reviewed: 08/01/2019 Elsevier Patient Education  2021 [employer]. Abstain from smoking

## 2021-03-09 NOTE — Progress Notes (Signed)
Dr. Wyn Quaker informed plan to remove PAD, same done

## 2021-03-09 NOTE — Discharge Summary (Addendum)
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 Hypertension   . Scoliosis   . Seizures [name])     HOSPITAL COURSE:   Prevatteis a 60 year oldmalewith medical history significant forseizure disorder, hypertension and nicotine dependence who presents to the ER for evaluation of concerns for possible clot in his right foot. Patient states he noted a black discoloration over the fourth toe on his right foot as well as some discoloration of the fifth toe.  Atherosclerotic disease right lower extremity/PAD Right fourth toe dry gangrene -- CT angiogram shows multilevel peripheral vascular disease. -- Currently on IV heparin drip--now d/ced --Per Dr Wyn Quaker-- start aspirin and eliquis -- s/p right lower extremity angiogram :Stent placement to the distal right common iliac and proximal right external iliac artery with a 6 mm diameter by 37 mm length lifestream stent -- patient will need to closely follow-up with vascular as outpatient for further workup --cont statins  Nicotine abuse -- patient recommended smoking cessation -- continue nicotine patch  Hypertension started on amlodipine   Procedures: right lower extremity angiogram Family communication : none Consults : vascular CODE STATUS: full DVT Prophylaxis : Level of care: Progressive  Cardiac Status is: Inpatient   Dispo: The patient is from: Home  Anticipated d/c is to: Home  Patient currently ismedically stable to d/c.  D/w pt to f/u closely with Dr Wyn Quaker as out pt Spoke with brother [name] yday CONSULTS OBTAINED:  Treatment Team:  Annice Needy, MD  DRUG ALLERGIES:  No Known Allergies  DISCHARGE MEDICATIONS:   Allergies as of 03/09/2021   No Known Allergies     Medication List    STOP taking these medications   gabapentin 100 MG capsule Commonly known as: NEURONTIN     TAKE these medications   amLODipine 5 MG tablet Commonly known as: NORVASC Take 1 tablet (5 mg total) by mouth daily.   apixaban 5 MG Tabs tablet Commonly known as: ELIQUIS Take 1 tablet (5 mg total) by mouth 2 (two) times daily.   aspirin 81 MG EC tablet Take 1 tablet (81 mg total) by mouth daily. Swallow whole.   atorvastatin 20 MG tablet Commonly known as: LIPITOR Take 4 tablets (80 mg total) by mouth daily.   lamoTRIgine 100 MG tablet Commonly known as: LAMICTAL Take 100 mg by mouth in the morning and at bedtime.   levETIRAcetam 750 MG tablet Commonly known as: KEPPRA Take 750 mg by mouth 2 (two) times daily.   nicotine 21 mg/24hr patch Commonly known as: NICODERM CQ - dosed in mg/24 hours Place 1 patch (21 mg total) onto the skin daily.   oxyCODONE-acetaminophen 5-325 MG tablet Commonly known as: PERCOCET/ROXICET Take 1 tablet by mouth every 8 (eight) hours as needed for severe pain. Do not drive after taking this medication       If you experience worsening of your admission symptoms, develop shortness of breath,  life threatening emergency, suicidal or homicidal thoughts you must seek medical attention immediately by calling 911 or calling your MD immediately  if symptoms less severe.  You Must read complete instructions/literature along with all the possible adverse reactions/side effects for all the Medicines you take and that have  been prescribed to you. Take any new Medicines after you have completely understood and accept all the possible adverse reactions/side effects.   Please note  You were cared for by a hospitalist during your hospital stay. If you have any questions about your discharge medications or the care you received while you were in the hospital after you are discharged, you can call the unit and asked to speak with the hospitalist on call if the hospitalist that took care of you is not available. Once you are discharged, your primary care physician will handle any further medical issues. Please note that NO REFILLS for any discharge medications will be authorized once you are discharged, as it is imperative that you return to your primary care physician (or establish a relationship with a primary care physician if you do not have one) for your aftercare needs so that they can reassess your need for medications and monitor your lab values. Today   SUBJECTIVE   No new complaints  VITAL SIGNS:  Blood pressure 120/89, pulse 84, temperature 97.9 F (36.6 C), resp. rate 16, height 5\' 7"  (1.702 m), weight 48.2 kg, SpO2 100 %.  I/O:    Intake/Output Summary (Last 24 hours) at 03/09/2021 0951 Last data filed at 03/09/2021 0636 Gross per 24 hour  Intake 2080.7 ml  Output 1000 ml  Net 1080.7 ml    PHYSICAL EXAMINATION:  GENERAL:  61 y.o.-year-old patient lying in the bed with no acute distress.  LUNGS: Normal breath sounds bilaterally, no wheezing, rales,rhonchi or crepitation. No use of accessory muscles of respiration.  CARDIOVASCULAR: S1, S2 normal. No murmurs, rubs, or gallops.  ABDOMEN: Soft, non-tender, non-distended. Bowel sounds present. No organomegaly or mass.  EXTREMITIES: right 4th toe dry gangrene NEUROLOGIC: Cranial nerves II through XII are intact. Muscle strength 5/5 in all extremities. Sensation intact. Gait not checked.  PSYCHIATRIC: The patient is alert and oriented x 3.  SKIN: No obvious  rash, lesion, or ulcer.   DATA REVIEW:   CBC  Recent Labs  Lab 03/09/21 0433  WBC 5.7  HGB 10.3*  HCT 29.6*  PLT 246    Chemistries  Recent Labs  Lab 03/06/21 1619 03/07/21 0200 03/07/21 0328 03/09/21 0433  NA 135  --    < > 134*  K 2.8*  --    < > 3.8  CL 94*  --    < > 104  CO2 30  --    < > 23  GLUCOSE 115*  --    < > 92  BUN 6  --    < > 5*  CREATININE 0.76  --    < > 0.61  CALCIUM 8.8*  --    < > 8.0*  MG  --  1.5*  --   --   AST 27  --   --   --   ALT 10  --   --   --   ALKPHOS 119  --   --   --   BILITOT 0.8  --   --   --    < > = values in this interval not displayed.    Microbiology Results   Recent Results (from the  past 240 hour(s))  Culture, blood (routine x 2)     Status: None (Preliminary result)   Collection Time: 03/06/21  4:41 PM   Specimen: Left Antecubital; Blood  Result Value Ref Range Status   Specimen Description LEFT ANTECUBITAL  Final   Special Requests   Final    BOTTLES DRAWN AEROBIC AND ANAEROBIC Blood Culture results may not be optimal due to an inadequate volume of blood received in culture bottles   Culture   Final    NO GROWTH 3 DAYS Performed at [facility], [address], [place], [place] [address]    Report Status PENDING  Incomplete  Culture, blood (routine x 2)     Status: None (Preliminary result)   Collection Time: 03/06/21  4:46 PM   Specimen: Right Antecubital; Blood  Result Value Ref Range Status   Specimen Description RIGHT ANTECUBITAL  Final   Special Requests   Final    BOTTLES DRAWN AEROBIC AND ANAEROBIC Blood Culture results may not be optimal due to an inadequate volume of blood received in culture bottles   Culture   Final    NO GROWTH 3 DAYS Performed at [facility], [address] [place], [place] [address]    Report Status PENDING  Incomplete  SARS CORONAVIRUS 2 (TAT 6-24 HRS) Nasopharyngeal Nasopharyngeal Swab     Status: None   Collection Time: 03/06/21  5:16 PM   Specimen:  Nasopharyngeal Swab  Result Value Ref Range Status   SARS Coronavirus 2 NEGATIVE NEGATIVE Final    Comment: (NOTE) SARS-CoV-2 target nucleic acids are NOT DETECTED.  The SARS-CoV-2 RNA is generally detectable in upper and lower respiratory specimens during the acute phase of infection. Negative results do not preclude SARS-CoV-2 infection, do not rule out co-infections with other pathogens, and should not be used as the sole basis for treatment or other patient management decisions. Negative results must be combined with clinical observations, patient history, and epidemiological information. The expected result is Negative.  Fact Sheet for Patients: HairSlick.no  Fact Sheet for Healthcare Providers: quierodirigir.com  This test is not yet approved or cleared by the [place] FDA and  has been authorized for detection and/or diagnosis of SARS-CoV-2 by FDA under an Emergency Use Authorization (EUA). This EUA will remain  in effect (meaning this test can be used) for the duration of the COVID-19 declaration under Se ction 564(b)(1) of the Act, 21 U.S.C. section 360bbb-3(b)(1), unless the authorization is terminated or revoked sooner.  Performed at [facility], [address], [place], [place] [address]     RADIOLOGY:  PERIPHERAL VASCULAR CATHETERIZATION  Result Date: 03/08/2021 See op note    CODE STATUS:     Code Status Orders  (From admission, onward)         Start     Ordered   03/06/21 2157  Full code  Continuous        03/06/21 2158        Code Status History    This patient has a current code status but no historical code status.   Advance Care Planning Activity       TOTAL TIME TAKING CARE OF THIS PATIENT: *35* minutes.    Enedina Finner M.D  [facility]    CC: Primary care physician; [facility]

## 2021-03-09 NOTE — Progress Notes (Signed)
No orders to manage the PAD device, ICU charge was contacted for advice as per policy device was deflated and R groin site assessed noted to be at vascular assessment scale of 0. Nil complaints voiced by pt. Will f/u with physician in the morning.

## 2021-03-11 ENCOUNTER — Encounter: Payer: Self-pay | Admitting: Vascular Surgery

## 2021-03-11 LAB — CULTURE, BLOOD (ROUTINE X 2)
Culture: NO GROWTH
Culture: NO GROWTH

## 2021-03-12 ENCOUNTER — Encounter (INDEPENDENT_AMBULATORY_CARE_PROVIDER_SITE_OTHER): Payer: Self-pay | Admitting: Vascular Surgery

## 2021-03-12 ENCOUNTER — Ambulatory Visit (INDEPENDENT_AMBULATORY_CARE_PROVIDER_SITE_OTHER): Payer: Medicaid Other | Admitting: Vascular Surgery

## 2021-03-12 ENCOUNTER — Other Ambulatory Visit: Payer: Self-pay

## 2021-03-12 VITALS — BP 120/73 | HR 72 | Resp 16 | Wt 107.8 lb

## 2021-03-12 DIAGNOSIS — I10 Essential (primary) hypertension: Secondary | ICD-10-CM

## 2021-03-12 DIAGNOSIS — F17200 Nicotine dependence, unspecified, uncomplicated: Secondary | ICD-10-CM

## 2021-03-12 DIAGNOSIS — I70221 Atherosclerosis of native arteries of extremities with rest pain, right leg: Secondary | ICD-10-CM

## 2021-03-12 DIAGNOSIS — I7410 Embolism and thrombosis of unspecified parts of aorta: Secondary | ICD-10-CM

## 2021-03-12 NOTE — Progress Notes (Signed)
MRN : [id number]  Albert [name] is a 60 y.o. ([birth year]) male who presents with chief complaint of  Chief Complaint  Patient presents with  . Follow-up    [facility] follow up  .  History of Present Illness: Patient returns today in follow up of his PAD with severe aortoiliac disease.  He is describing significant claudication symptoms in both lower extremities.  His right foot feels a little better after right iliac intervention, but is still painful particularly with any activity.  The pain still wakes him at night concerning for ischemic rest pain.  No periprocedural complications.  His access site has some bruising but is healing okay.  He had severe aortic disease going all the way up to and just above the left renal artery which was the lowest of the 2 renal arteries.  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  He is taking aspirin and Lipitor.  Eliquis was cost prohibitive.  Current Outpatient Medications  Medication Sig Dispense Refill  . acetaminophen (TYLENOL) 500 MG tablet Take 500 mg by mouth every 6 (six) hours as needed.    Marland Kitchen amLODipine (NORVASC) 5 MG tablet Take 1 tablet (5 mg total) by mouth daily. 30 tablet 3  . aspirin EC 81 MG EC tablet Take 1 tablet (81 mg total) by mouth daily. Swallow whole. 30 tablet 11  . atorvastatin (LIPITOR) 20 MG tablet Take 4 tablets (80 mg total) by mouth daily. 30 tablet 3  . ibuprofen (ADVIL) 200 MG tablet Take 200 mg by mouth every 6 (six) hours as needed.    . lamoTRIgine (LAMICTAL) 100 MG tablet Take 100 mg by mouth in the morning and at bedtime.    . levETIRAcetam (KEPPRA) 750 MG tablet Take 750 mg by mouth 2 (two) times daily.    . nicotine (NICODERM CQ - DOSED IN MG/24 HOURS) 21 mg/24hr patch Place 1 patch (21 mg total) onto the skin daily. 28 patch 0  . apixaban (ELIQUIS) 5 MG TABS tablet  Take 1 tablet (5 mg total) by mouth 2 (two) times daily. (Patient not taking: No sig reported) 60 tablet 3  . oxyCODONE-acetaminophen (PERCOCET/ROXICET) 5-325 MG tablet Take 1 tablet by mouth every 8 (eight) hours as needed for severe pain. Do not drive after taking this medication (Patient not taking: Reported on 03/12/2021) 15 tablet 0   No current facility-administered medications for this visit.    Past Medical History:  Diagnosis Date  . Hypertension   . Scoliosis   . Seizures ([name])     Past Surgical History:  Procedure Laterality Date  . ABDOMINAL AORTOGRAM W/LOWER EXTREMITY N/A 03/08/2021   Procedure: ABDOMINAL AORTOGRAM W/LOWER EXTREMITY;  Surgeon: Annice Needy, MD;  Location: [facility] INVASIVE CV LAB;  Service: Cardiovascular;  Laterality: N/A;     Social History   Tobacco Use  . Smoking status: Former Smoker    Quit date: 03/05/2021    Years since quitting: 0.0  . Smokeless tobacco: Never Used  Substance Use Topics  . Alcohol use: Yes    Comment: occ  . Drug use: Yes    Types: Marijuana     Family History  Problem Relation Age of Onset  . Hypertension Father   No bleeding or clotting disorders.  No aneurysms  No Known Allergies   REVIEW OF SYSTEMS (Negative unless checked)  Constitutional: [] Weight loss  [] Fever  [] Chills Cardiac: [] Chest pain   [] Chest pressure   [] Palpitations   [] Shortness of breath when laying flat   [] Shortness of breath at rest   [] Shortness of breath with exertion. Vascular:  [x] Pain in legs with walking   [] Pain in legs at rest   [] Pain in legs when laying flat   [] Claudication   [] Pain in feet when walking  [x] Pain in feet at rest  [x] Pain in feet when laying flat   [] History of DVT   [] Phlebitis   [] Swelling in legs   [] Varicose veins   [] Non-healing ulcers Pulmonary:   [] Uses home oxygen   [] Productive cough   [] Hemoptysis   [] Wheeze  [] COPD   [] Asthma Neurologic:  [] Dizziness  [] Blackouts   [x] Seizures   [] History of stroke   [] History of  TIA  [] Aphasia   [] Temporary blindness   [] Dysphagia   [] Weakness or numbness in arms   [] Weakness or numbness in legs Musculoskeletal:  [] Arthritis   [] Joint swelling   [] Joint pain   [] Low back pain Hematologic:  [] Easy bruising  [] Easy bleeding   [] Hypercoagulable state   [] Anemic   Gastrointestinal:  [] Blood in stool   [] Vomiting blood  [] Gastroesophageal reflux/heartburn   [] Abdominal pain Genitourinary:  [] Chronic kidney disease   [] Difficult urination  [] Frequent urination  [] Burning with urination   [] Hematuria Skin:  [] Rashes   [] Ulcers   [] Wounds Psychological:  [] History of anxiety   []  History of major depression.  Physical Examination  BP 120/73 (BP Location: Right Arm)   Pulse 72   Resp 16   Wt 107 lb 12.8 oz (48.9 kg)   BMI 16.88 kg/m  Gen:  WD/WN, NAD Head: [place]/AT, No temporalis wasting. Ear/Nose/Throat: Hearing grossly intact, nares w/o erythema or drainage Eyes: Conjunctiva clear. Sclera non-icteric Neck: Supple.  Trachea midline Pulmonary:  Good air movement, no use of accessory muscles.  Cardiac: RRR, no JVD Vascular:  Vessel Right Left  Radial Palpable Palpable                          PT  not palpable  not palpable  DP  not palpable  not palpable    Musculoskeletal: M/S 5/5 throughout.  No deformity or atrophy.  No significant lower extremity edema.  Capillary refill is somewhat diminished bilaterally Neurologic: Sensation grossly intact in extremities.  Symmetrical.  Speech is fluent.  Psychiatric: Judgment intact, Mood & affect appropriate for pt's clinical situation. Dermatologic: No rashes or ulcers noted.  No cellulitis or open wounds.       Labs Recent Results (from the past 2160 hour(s))  CBC with Differential     Status: Abnormal   Collection Time: 03/06/21  4:19 PM  Result Value Ref Range   WBC 8.6 4.0 - 10.5 K/uL   RBC 3.17 (L) 4.22 - 5.81 MIL/uL   Hemoglobin 11.0 (L) 13.0 - 17.0 g/dL   HCT (L) - %   MCV 99.7 80.0 -  100.0 fL   MCH 34.7 (H) 26.0 - 34.0 pg   MCHC 34.8 30.0 - 36.0 g/dL   RDW (H) - %   Platelets 272 150 - 400 K/uL   nRBC 0.0 0.0 - 0.2 %   Neutrophils Relative % 66 %   Neutro Abs 5.7 1.7 - 7.7 K/uL   Lymphocytes Relative 26 %   Lymphs Abs 2.2 0.7 - 4.0 K/uL   Monocytes Relative  7 %   Monocytes Absolute 0.6 0.1 - 1.0 K/uL   Eosinophils Relative 0 %   Eosinophils Absolute 0.0 0.0 - 0.5 K/uL   Basophils Relative 0 %   Basophils Absolute 0.0 0.0 - 0.1 K/uL   Immature Granulocytes 1 %   Abs Immature Granulocytes 0.04 0.00 - 0.07 K/uL    Comment: Performed at [facility], [address] [place], [place] [address]  Comprehensive metabolic panel     Status: Abnormal   Collection Time: 03/06/21  4:19 PM  Result Value Ref Range   Sodium 135 135 - 145 mmol/L   Potassium 2.8 (L) 3.5 - 5.1 mmol/L   Chloride 94 (L) 98 - 111 mmol/L   CO2 30 22 - 32 mmol/L   Glucose, Bld 115 (H) 70 - 99 mg/dL    Comment: Glucose reference range applies only to samples taken after fasting for at least 8 hours.   BUN 6 6 - 20 mg/dL   Creatinine, Ser 7.16 0.61 - 1.24 mg/dL   Calcium 8.8 (L) 8.9 - 10.3 mg/dL   Total Protein 6.7 6.5 - 8.1 g/dL   Albumin 3.3 (L) 3.5 - 5.0 g/dL   AST 27 15 - 41 U/L   ALT 10 0 - 44 U/L   Alkaline Phosphatase 119 38 - 126 U/L   Total Bilirubin 0.8 0.3 - 1.2 mg/dL   GFR, Estimated >96 >78 mL/min    Comment: (NOTE) Calculated using the CKD-EPI Creatinine Equation (2021)    Anion gap 11 5 - 15    Comment: Performed at [facility], [address] [place], [place] [address]  Protime-INR     Status: None   Collection Time: 03/06/21  4:19 PM  Result Value Ref Range   Prothrombin Time 12.6 11.4 - 15.2 seconds   INR 0.9 0.8 - 1.2    Comment: (NOTE) INR goal varies based on device and disease states. Performed at [facility], [address] [place], [place] [address]   Lactic acid, plasma     Status: Abnormal   Collection Time:  03/06/21  4:41 PM  Result Value Ref Range   Lactic Acid, Venous 2.9 (HH) 0.5 - 1.9 mmol/L    Comment: CRITICAL RESULT CALLED TO, READ BACK BY AND VERIFIED WITH ZACK REGISTER @1811  ON 03/06/21 SKL Performed at [facility], [address] [place], [place]   Culture, blood (routine x 2)     Status: None   Collection Time: 03/06/21  4:41 PM   Specimen: Left Antecubital; Blood  Result Value Ref Range   Specimen Description LEFT ANTECUBITAL    Special Requests      BOTTLES DRAWN AEROBIC AND ANAEROBIC Blood Culture results may not be optimal due to an inadequate volume of blood received in culture bottles   Culture      NO GROWTH 5 DAYS Performed at [facility], [address] [place], [place]    Report Status 03/11/2021 FINAL   Culture, blood (routine x 2)     Status: None   Collection Time: 03/06/21  4:46 PM   Specimen: Right Antecubital; Blood  Result Value Ref Range   Specimen Description RIGHT ANTECUBITAL    Special Requests      BOTTLES DRAWN AEROBIC AND ANAEROBIC Blood Culture results may not be optimal due to an inadequate volume of blood received in culture bottles   Culture      NO GROWTH 5 DAYS Performed at [facility], [address], [place], [place] [address]    Report Status 03/11/2021 FINAL   SARS CORONAVIRUS 2 (TAT 6-24 HRS) Nasopharyngeal Nasopharyngeal Swab     Status: None   Collection Time: 03/06/21  5:16 PM   Specimen: Nasopharyngeal Swab  Result Value Ref Range   SARS Coronavirus 2 NEGATIVE NEGATIVE    Comment: (NOTE) SARS-CoV-2 target nucleic acids are NOT DETECTED.  The SARS-CoV-2 RNA is generally detectable in upper and lower respiratory specimens during the acute phase of infection. Negative results do not preclude SARS-CoV-2 infection, do not rule out co-infections with other pathogens, and should not be used as the sole basis for treatment or other patient management decisions. Negative results must  be combined with clinical observations, patient history, and epidemiological information. The expected result is Negative.  Fact Sheet for Patients: HairSlick.no  Fact Sheet for Healthcare Providers: quierodirigir.com  This test is not yet approved or cleared by the [place] FDA and  has been authorized for detection and/or diagnosis of SARS-CoV-2 by FDA under an Emergency Use Authorization (EUA). This EUA will remain  in effect (meaning this test can be used) for the duration of the COVID-19 declaration under Se ction 564(b)(1) of the Act, 21 U.S.C. section 360bbb-3(b)(1), unless the authorization is terminated or revoked sooner.  Performed at [facility], [address], [place], [place] [address]   Lactic acid, plasma     Status: None   Collection Time: 03/06/21 10:31 PM  Result Value Ref Range   Lactic Acid, Venous 1.8 0.5 - 1.9 mmol/L    Comment: Performed at [facility], [address] [place], [place] [address]  Heparin level (unfractionated)     Status: Abnormal   Collection Time: 03/07/21  2:00 AM  Result Value Ref Range   Heparin Unfractionated 0.15 (L) 0.30 - 0.70 IU/mL    Comment: (NOTE) The clinical reportable range upper limit is being lowered to >1.10 to align with the FDA approved guidance for the current laboratory assay.  If heparin results are below expected values, and patient dosage has  been confirmed, suggest follow up testing of antithrombin III levels. Performed at [facility], [address] [place], [place] [address]   Magnesium     Status: Abnormal   Collection Time: 03/07/21  2:00 AM  Result Value Ref Range   Magnesium 1.5 (L) 1.7 - 2.4 mg/dL    Comment: Performed at [facility], [address] [place], [place] [address]  CBC     Status: Abnormal   Collection Time: 03/07/21  3:28 AM  Result Value Ref Range   WBC 6.2 4.0 - 10.5 K/uL   RBC  2.57 (L) 4.22 - 5.81 MIL/uL   Hemoglobin 8.9 (L) 13.0 - 17.0 g/dL   HCT 84.6 (L) 96.2 - 95.2 %   MCV 101.6 (H) 80.0 - 100.0 fL   MCH 34.6 (H) 26.0 - 34.0 pg   MCHC 34.1 30.0 - 36.0 g/dL   RDW 84.1 (H) 32.4 - 40.1 %   Platelets 217 150 - 400 K/uL   nRBC 0.0 0.0 - 0.2 %    Comment: Performed at [facility], [address], [place], [place] [address]  Basic metabolic panel     Status: Abnormal   Collection Time: 03/07/21  3:28 AM  Result Value Ref Range   Sodium 138 135 - 145 mmol/L   Potassium 3.2 (L) 3.5 - 5.1 mmol/L   Chloride 103 98 -  111 mmol/L   CO2 26 22 - 32 mmol/L   Glucose, Bld 90 70 - 99 mg/dL    Comment: Glucose reference range applies only to samples taken after fasting for at least 8 hours.   BUN <5 (L) 6 - 20 mg/dL   Creatinine, Ser 1.61 0.61 - 1.24 mg/dL   Calcium 7.6 (L) 8.9 - 10.3 mg/dL   GFR, Estimated >09 >60 mL/min    Comment: (NOTE) Calculated using the CKD-EPI Creatinine Equation (2021)    Anion gap 9 5 - 15    Comment: Performed at [facility], [address] [place], [place] [address]  HIV Antibody (routine testing w rflx)     Status: None   Collection Time: 03/07/21  3:28 AM  Result Value Ref Range   HIV Screen 4th Generation wRfx Non Reactive Non Reactive    Comment: Performed at [facility], [address] [place], [place] [address]  Heparin level (unfractionated)     Status: None   Collection Time: 03/07/21 10:21 AM  Result Value Ref Range   Heparin Unfractionated 0.50 0.30 - 0.70 IU/mL    Comment: (NOTE) The clinical reportable range upper limit is being lowered to >1.10 to align with the FDA approved guidance for the current laboratory assay.  If heparin results are below expected values, and patient dosage has  been confirmed, suggest follow up testing of antithrombin III levels. Performed at [facility], [address] [place], [place] [address]   Heparin level (unfractionated)     Status: None    Collection Time: 03/07/21  5:16 PM  Result Value Ref Range   Heparin Unfractionated 0.51 0.30 - 0.70 IU/mL    Comment: (NOTE) The clinical reportable range upper limit is being lowered to >1.10 to align with the FDA approved guidance for the current laboratory assay.  If heparin results are below expected values, and patient dosage has  been confirmed, suggest follow up testing of antithrombin III levels. Performed at [facility], [address] [place], [place] [address]   Basic metabolic panel     Status: Abnormal   Collection Time: 03/08/21  4:49 AM  Result Value Ref Range   Sodium 132 (L) 135 - 145 mmol/L   Potassium 2.9 (L) 3.5 - 5.1 mmol/L   Chloride 98 98 - 111 mmol/L   CO2 24 22 - 32 mmol/L   Glucose, Bld 82 70 - 99 mg/dL    Comment: Glucose reference range applies only to samples taken after fasting for at least 8 hours.   BUN <5 (L) 6 - 20 mg/dL   Creatinine, Ser 0.86 (L) 0.61 - 1.24 mg/dL   Calcium 7.5 (L) 8.9 - 10.3 mg/dL   GFR, Estimated >57 >84 mL/min    Comment: (NOTE) Calculated using the CKD-EPI Creatinine Equation (2021)    Anion gap 10 5 - 15    Comment: Performed at [facility], [address] [place], [place] [address]  Heparin level (unfractionated)     Status: None   Collection Time: 03/08/21  4:49 AM  Result Value Ref Range   Heparin Unfractionated 0.34 0.30 - 0.70 IU/mL    Comment: (NOTE) The clinical reportable range upper limit is being lowered to >1.10 to align with the FDA approved guidance for the current laboratory assay.  If heparin results are below expected values, and patient dosage has  been confirmed, suggest follow up testing of antithrombin III levels. Performed at [facility],  [address] [place], [place] [address]   CBC     Status: Abnormal   Collection Time: 03/08/21  4:49 AM  Result Value Ref Range   WBC 6.5 4.0 - 10.5 K/uL   RBC 2.82 (L) 4.22 - 5.81 MIL/uL   Hemoglobin 9.8 (L) 13.0 -  17.0 g/dL   HCT 19.127.5 (L) 47.839.0 - 29.552.0 %   MCV 97.5 80.0 - 100.0 fL   MCH 34.8 (H) 26.0 - 34.0 pg   MCHC 35.6 30.0 - 36.0 g/dL   RDW 62.117.5 (H) 30.811.5 - 65.715.5 %   Platelets 247 150 - 400 K/uL   nRBC 0.0 0.0 - 0.2 %    Comment: Performed at [facility], [address] [place], [place] [address]  Basic metabolic panel     Status: Abnormal   Collection Time: 03/08/21 10:31 AM  Result Value Ref Range   Sodium 131 (L) 135 - 145 mmol/L   Potassium 3.0 (L) 3.5 - 5.1 mmol/L   Chloride 97 (L) 98 - 111 mmol/L   CO2 22 22 - 32 mmol/L   Glucose, Bld 69 (L) 70 - 99 mg/dL    Comment: Glucose reference range applies only to samples taken after fasting for at least 8 hours.   BUN <5 (L) 6 - 20 mg/dL   Creatinine, Ser 2.950.60 (L) 0.61 - 1.24 mg/dL   Calcium 7.6 (L) 8.9 - 10.3 mg/dL   GFR, Estimated >28>60 >41>60 mL/min    Comment: (NOTE) Calculated using the CKD-EPI Creatinine Equation (2021)    Anion gap 12 5 - 15    Comment: Performed at [facility], [address] [place], [place] [address]  CBC     Status: Abnormal   Collection Time: 03/09/21  4:33 AM  Result Value Ref Range   WBC 5.7 4.0 - 10.5 K/uL   RBC 3.00 (L) 4.22 - 5.81 MIL/uL   Hemoglobin 10.3 (L) 13.0 - 17.0 g/dL   HCT 10.229.6 (L) 72.539.0 - 36.652.0 %   MCV 98.7 80.0 - 100.0 fL   MCH 34.3 (H) 26.0 - 34.0 pg   MCHC 34.8 30.0 - 36.0 g/dL   RDW 44.017.4 (H) 34.711.5 - 42.515.5 %   Platelets 246 150 - 400 K/uL   nRBC 0.0 0.0 - 0.2 %    Comment: Performed at [facility], [address] [place], [place] [address]  Basic metabolic panel     Status: Abnormal   Collection Time: 03/09/21  4:33 AM  Result Value Ref Range   Sodium 134 (L) 135 - 145 mmol/L   Potassium 3.8 3.5 - 5.1 mmol/L   Chloride 104 98 - 111 mmol/L   CO2 23 22 - 32 mmol/L   Glucose, Bld 92 70 - 99 mg/dL    Comment: Glucose reference range applies only to samples taken after fasting for at least 8 hours.   BUN 5 (L) 6 - 20 mg/dL   Creatinine, Ser 7.560.61 0.61 - 1.24  mg/dL   Calcium 8.0 (L) 8.9 - 10.3 mg/dL   GFR, Estimated >43>60 >32>60 mL/min    Comment: (NOTE) Calculated using the CKD-EPI Creatinine Equation (2021)    Anion gap 7 5 - 15    Comment: Performed at [facility], [address] [place], [place] [address]    Radiology CT Angio Aortobifemoral W and/or Wo Contrast  Result Date: 03/06/2021 CLINICAL DATA:  Discoloration of the right fourth toe with right foot pain EXAM: CT ANGIOGRAPHY OF ABDOMINAL  AORTA WITH ILIOFEMORAL RUNOFF TECHNIQUE: Multidetector CT imaging of the abdomen, pelvis and lower extremities was performed using the standard protocol during bolus administration of intravenous contrast. Multiplanar CT image reconstructions and MIPs were obtained to evaluate the vascular anatomy. CONTRAST:  OMNIPAQUE IOHEXOL 350 MG/ML SOLN COMPARISON:  None. FINDINGS: VASCULAR Aorta: Atherosclerotic calcifications are noted with considerable mural thrombus within the infrarenal aorta. Celiac: Patent without evidence of aneurysm, dissection, vasculitis or significant stenosis. SMA: Patent without evidence of aneurysm, dissection, vasculitis or significant stenosis. Renals: Both renal arteries are patent without evidence of aneurysm, dissection, vasculitis, fibromuscular dysplasia or significant stenosis. IMA: Patent without evidence of aneurysm, dissection, vasculitis or significant stenosis. RIGHT Lower Extremity Inflow: Atherosclerotic calcifications are noted within the common and external iliac arteries on the right. Irregular filling defect is noted at the level of the iliac bifurcation with occlusion of the right internal iliac artery at its origin with reconstitution via multiple muscular collaterals in the deeper pelvis. Additionally there is high-grade narrowing of the distal aspect of the common iliac artery extending into the external iliac artery. The external iliac artery is within normal limits. Runoff: Superficial femoral artery is  widely patent. Atherosclerotic changes in the proximal and mid popliteal artery are noted. Popliteal trifurcation is patent with 2 vessel runoff to the level of the right foot. Digital arteries are noted extending into the distal aspect of the foot without occlusive change. LEFT Lower Extremity Inflow: There is occlusion of the left common iliac artery at its origin related to the mural thrombus in the distal aorta. Reconstitution of the proximal aspect left internal iliac artery is noted although multiple filling defects are noted within consistent with thrombi/emboli. Reconstitution of the common femoral artery is noted primarily via the inferior epigastric artery on the left as well as multiple muscular collaterals to include the deep circumflex iliac artery on the left. Runoff: Common femoral bifurcation is patent although a tubular filling defect is noted in the proximal aspect of the left superficial femoral artery consistent with thrombus. This is attached at the level of the common femoral artery. Distal superficial femoral artery is within normal limits. Popliteal artery is patent as well. The popliteal trifurcation however shows only patency of the anterior tibial artery proximally. Some segmental reconstitution of the peroneal artery is seen. Mid calf occlusion of the anterior tibial artery is noted with some distal reconstitution via muscular collaterals. Mild reconstitution of the distal aspect of the posterior tibial artery is noted as well. Evaluation of the foot is limited due to the multiple areas of occlusion. Veins: No specific venous abnormality is noted. Review of the MIP images confirms the above findings. NON-VASCULAR Lower chest: No acute abnormality. Hepatobiliary: Mild dependent gallstones are seen without complicating factors. The liver is unremarkable. Pancreas: Unremarkable. No pancreatic ductal dilatation or surrounding inflammatory changes. Spleen: Normal in size without focal  abnormality. Adrenals/Urinary Tract: Adrenal glands are mildly hypertrophied without focal mass. Kidneys demonstrate scattered hypodensities likely related to renal cyst but incompletely evaluated this exam. 7 mm nonobstructing stone is noted in the midportion of the left kidney. The ureters are within normal limits. Bladder is partially distended. Stomach/Bowel: Scattered diverticular change of the colon is noted without evidence of diverticulitis. The appendix is within normal limits. Small bowel and stomach are unremarkable. Lymphatic: No significant lymphadenopathy is noted. Reproductive: Prostate is within normal limits. Other: No abdominal wall hernia or abnormality. No abdominopelvic ascites. Musculoskeletal: No acute bony abnormality is noted. 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 Irregular filling defect within the distal common iliac artery on the right extending into the proximal aspect of the external iliac artery on the right with high-grade stenosis identified. Occlusion of the right internal iliac artery at its origin is noted secondary to this filling defect. This is highly suspicious for underlying embolus. Runoff on the right shows atherosclerotic disease although 2 vessel runoff to the level of the right foot and subsequently into the foot is identified. The left common iliac artery occlusion extends to the level of the common femoral artery with significant reconstitution via the deep circumflex iliac artery and inferior epigastric arteries on the left. Some reconstitution of the distal aspect of the left internal iliac artery is noted although multifocal filling defects and occlusive changes are seen again highly suspicious for emboli. Runoff on the left shows a tubular filling defect within the proximal superficial femoral artery which appears attached to the arterial wall in  the distal common iliac artery. More distal runoff shows occlusion of the tibioperoneal trunk with minimal reconstitution of the peroneal and posterior tibial arteries in the distal left leg. The anterior tibial artery is patent proximally but demonstrates short segment occlusion in the mid to distal calf with reconstitution via muscular collaterals. More distal flow into the foot is not well visualized in part due to the multifocal occlusions. Correlation with Doppler pulses is recommended. NON-VASCULAR Cholelithiasis without complicating factors. Nonobstructing left renal stone as described. Diverticulosis without diverticulitis. Cystic changes in the kidneys bilaterally. These are stable from a prior MRI from October of 2021. Critical Value/emergent results were called by telephone at the time of interpretation on 03/06/2021 at 7:10 pm to [facility] , who verbally acknowledged these results. Electronically Signed   By: Alcide Clever M.D.   On: 03/06/2021 19:09   PERIPHERAL VASCULAR CATHETERIZATION  Result Date: 03/08/2021 See op note  DG Foot Complete Right  Result Date: 03/06/2021 CLINICAL DATA:  Toe necrosis, foot pain EXAM: RIGHT FOOT COMPLETE - 3+ VIEW COMPARISON:  None. FINDINGS: Degenerative changes of the 1st MTP joint with joint space narrowing and spurring. No acute bony abnormality. Specifically, no fracture, subluxation, or dislocation. Plantar calcaneal spur. IMPRESSION: No acute bony abnormality. Electronically Signed   By: Charlett Nose M.D.   On: 03/06/2021 17:21    Assessment/Plan  Atherosclerosis of native arteries of extremity with rest pain ([name]) He had severe aortic disease going all the way up to and just above the left renal artery which was the lowest of the 2 renal arteries.  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   Given the extensive aortic thrombus going up and even above the left renal artery, I am hesitant to offer him endoluminal therapy particularly given his young age and reasonably good status for open surgical repair.  I think the safest option particular for his left kidney is likely open surgical therapy which would be an aortobifemoral bypass likely with an aortic endarterectomy and may be a left renal endarterectomy as well.  This is a very big serious procedure, and would have an extended hospital stay and recovery but I think it will serve him the best long-term.  I have given him several weeks of Eliquis samples today in the office that will  take [place] up to the time of surgery.  After his repair, I think we can consider just going to antiplatelet therapy.  Risks and benefits the procedure were discussed in detail and he is agreeable to proceed.  Aortic mural thrombus ([name]) See above.  This is very extensive and goes up to and even above the left renal artery.  Hypertension blood pressure control important in reducing the progression of atherosclerotic disease. On appropriate oral medications.   Tobacco dependence This is the major atherosclerotic risk factor in his case and cessation would be of great benefit.    Festus Barren, MD  03/13/2021 8:53 AM    This note was created with Dragon medical transcription system.  Any errors from dictation are purely unintentional

## 2021-03-13 DIAGNOSIS — I70229 Atherosclerosis of native arteries of extremities with rest pain, unspecified extremity: Secondary | ICD-10-CM | POA: Insufficient documentation

## 2021-03-13 NOTE — Assessment & Plan Note (Signed)
This is the major atherosclerotic risk factor in his case and cessation would be of great benefit.

## 2021-03-13 NOTE — Assessment & Plan Note (Signed)
See above.  This is very extensive and goes up to and even above the left renal artery.

## 2021-03-13 NOTE — Patient Instructions (Signed)
Aortofemoral Bypass  An aortofemoral bypass is a surgery to change the way that blood flows through the upper legs or abdomen. This is a major surgery. You may have this surgery if blood vessels in your upper legs or abdomen are blocked, damaged, or too narrow, and blood cannot move through them well. During the surgery, an artificial (synthetic) piece of tissue called a graft is sewn above and below the blocked, damaged, or narrowed blood vessels. After the surgery, blood will be able to travel around these blood vessels by going through the graft. Tell a health care provider about:  Any allergies you have.  All medicines you are taking, including vitamins, herbs, eye drops, creams, and over-the-counter medicines.  Any problems you or family members have had with anesthetic medicines.  Any blood disorders you have.  Any surgeries you have had.  Any medical conditions you have.  Whether you are pregnant or may be pregnant. What are the risks? Generally, this is a safe procedure. However, problems may occur, including:  Infection.  Bleeding.  Allergic reactions to medicines or dyes.  Damage to nearby structures or organs.  The graft becoming blocked or not working.  Nerve damage.  Heart attack.  Stroke. What happens before the procedure? Staying hydrated Follow instructions from your health care provider about hydration, which may include:  Up to 2 hours before the procedure - you may continue to drink clear liquids, such as water, clear fruit juice, black coffee, and plain tea.   Eating and drinking restrictions Follow instructions from your health care provider about eating and drinking, which may include:  8 hours before the procedure - stop eating heavy meals or foods, such as meat, fried foods, or fatty foods.  6 hours before the procedure - stop eating light meals or foods, such as toast or cereal.  6 hours before the procedure - stop drinking milk or drinks that  contain milk.  2 hours before the procedure - stop drinking clear liquids. Medicines Ask your health care provider about:  Changing or stopping your regular medicines. This is especially important if you are taking diabetes medicines or blood thinners.  Taking medicines such as aspirin and ibuprofen. These medicines can thin your blood. Do not take these medicines unless your health care provider tells you to take them.  Taking over-the-counter medicines, vitamins, herbs, and supplements. Tests You may have an exam or testing, which may include:  A physical exam.  Blood tests.  Tests to measure your blood flow, such as: ? Ankle-brachial index (ABI). This test compares blood pressure in your arm and ankle. ? Doppler ultrasound. This test uses sound waves to look at how your blood is flowing through blood vessels. ? Imaging tests, such as CT scan, MRI, or X-rays.  Tests to check your heart, such as an electrocardiogram (ECG). An ECG checks the electrical patterns and rhythms of the heart. General instructions  Do not use any products that contain nicotine or tobacco for at least 4 weeks before the procedure. These products include cigarettes, chewing tobacco, and vaping devices, such as e-cigarettes. If you need help quitting, ask your health care provider.  Plan to have a responsible adult take you home from the hospital or clinic.  Plan to have a responsible adult care for you for the time you are told after you leave the hospital or clinic. This is important.  Ask your health care provider: ? How your surgery site will be marked. ? What steps will  be taken to help prevent infection. These may include:  Removing hair at the surgery site.  Washing skin with a germ-killing soap.  Taking antibiotic medicine. What happens during the procedure?  An IV will be inserted into one of your veins.  You will be given one or both of the following: ? A medicine to help you relax  (sedative). ? A medicine to make you fall asleep (general anesthetic).  Incisions will be made in your abdomen and your groin.  A graft made from synthetic material will be sewn above and below the narrow, blocked, or damaged part of the affected blood vessels.  Small tubes may be placed to drain fluid from your body.  A thin tube called a catheter may be placed in the part of your body that drains urine from the bladder (urethra). The tube will temporarily drain your urine after the procedure.  The incisions will be closed with stitches (sutures).  A bandage (dressing) will be placed over the incisions. The procedure may vary among health care providers and hospitals. What happens after the procedure?  Your blood pressure, heart rate, breathing rate, and blood oxygen level will be monitored until you leave the hospital or clinic.  You will be given pain medicine as needed.  You will be instructed to cough and breathe deeply to help prevent lung infection.  Do not drive until your health care provider approves.  You will have to stay in the hospital after the procedure. Ask your health care provider how long you will stay. Summary  An aortofemoral bypass is a surgery to change the way that blood flows through the upper legs or abdomen.  Follow your health care provider's instructions about what to eat and drink before the procedure. You may also have to stop or change any medicines that you may be taking.  During the procedure, an artificial (synthetic) tissue called a graft is sewn above and below the narrow, blocked, or damaged part of the affected blood vessels.  You will have to stay in the hospital after the procedure. Ask your health care provider how long you will stay. This information is not intended to replace advice given to you by your health care provider. Make sure you discuss any questions you have with your health care provider. Document Revised: 03/26/2020  Document Reviewed: 03/26/2020 Elsevier Patient Education  2021 [employer].

## 2021-03-13 NOTE — Assessment & Plan Note (Signed)
blood pressure control important in reducing the progression of atherosclerotic disease. On appropriate oral medications.  

## 2021-03-13 NOTE — Assessment & Plan Note (Signed)
He had severe aortic disease going all the way up to and just above the left renal artery which was the lowest of the 2 renal arteries.  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  Given the extensive aortic thrombus going up and even above the left renal artery, I am hesitant to offer him endoluminal therapy particularly given his young age and reasonably good status for open surgical repair.  I think the safest option particular for his left kidney is likely open surgical therapy which would be an aortobifemoral bypass likely with an aortic endarterectomy and may be a left renal endarterectomy as well.  This is a very big serious procedure, and would have an extended hospital stay and recovery but I think it will serve him the best long-term.  I have given him several weeks of Eliquis samples today in the office that will take [place] up to the time of surgery.  After his repair, I think we can consider just going to antiplatelet therapy.  Risks and benefits the procedure were discussed in detail and he is agreeable to proceed.

## 2021-04-10 ENCOUNTER — Other Ambulatory Visit: Payer: Medicaid Other

## 2021-04-12 ENCOUNTER — Other Ambulatory Visit: Payer: Self-pay | Admitting: Internal Medicine

## 2021-04-12 DIAGNOSIS — I208 Other forms of angina pectoris: Secondary | ICD-10-CM

## 2021-04-12 DIAGNOSIS — Z01818 Encounter for other preprocedural examination: Secondary | ICD-10-CM

## 2021-04-15 ENCOUNTER — Emergency Department: Payer: Medicaid Other

## 2021-04-15 ENCOUNTER — Other Ambulatory Visit: Payer: Self-pay

## 2021-04-15 ENCOUNTER — Emergency Department
Admission: EM | Admit: 2021-04-15 | Discharge: 2021-04-15 | Disposition: A | Discharge status: Home / Self Care | Payer: Medicaid Other | Attending: Emergency Medicine | Admitting: Emergency Medicine

## 2021-04-15 DIAGNOSIS — R569 Unspecified convulsions: Secondary | ICD-10-CM

## 2021-04-15 DIAGNOSIS — Z79899 Other long term (current) drug therapy: Secondary | ICD-10-CM | POA: Diagnosis not present

## 2021-04-15 DIAGNOSIS — W1830XA Fall on same level, unspecified, initial encounter: Secondary | ICD-10-CM | POA: Diagnosis not present

## 2021-04-15 DIAGNOSIS — Z7901 Long term (current) use of anticoagulants: Secondary | ICD-10-CM | POA: Insufficient documentation

## 2021-04-15 DIAGNOSIS — I10 Essential (primary) hypertension: Secondary | ICD-10-CM | POA: Diagnosis not present

## 2021-04-15 DIAGNOSIS — Z7982 Long term (current) use of aspirin: Secondary | ICD-10-CM | POA: Insufficient documentation

## 2021-04-15 DIAGNOSIS — G40909 Epilepsy, unspecified, not intractable, without status epilepticus: Secondary | ICD-10-CM | POA: Insufficient documentation

## 2021-04-15 DIAGNOSIS — Z87891 Personal history of nicotine dependence: Secondary | ICD-10-CM | POA: Diagnosis not present

## 2021-04-15 LAB — CBC WITH DIFFERENTIAL/PLATELET
Abs Immature Granulocytes: 0.06 10*3/uL (ref 0.00–0.07)
Basophils Absolute: 0.1 10*3/uL (ref 0.0–0.1)
Basophils Relative: 0 %
Eosinophils Absolute: 0 10*3/uL (ref 0.0–0.5)
Eosinophils Relative: 0 %
HCT: 35.2 % — ABNORMAL LOW (ref 39.0–52.0)
Hemoglobin: 12.3 g/dL — ABNORMAL LOW (ref 13.0–17.0)
Immature Granulocytes: 1 %
Lymphocytes Relative: 11 %
Lymphs Abs: 1.3 10*3/uL (ref 0.7–4.0)
MCH: 34.7 pg — ABNORMAL HIGH (ref 26.0–34.0)
MCHC: 34.9 g/dL (ref 30.0–36.0)
MCV: 99.4 fL (ref 80.0–100.0)
Monocytes Absolute: 0.7 10*3/uL (ref 0.1–1.0)
Monocytes Relative: 6 %
Neutro Abs: 10.6 10*3/uL — ABNORMAL HIGH (ref 1.7–7.7)
Neutrophils Relative %: 82 %
Platelets: 346 10*3/uL (ref 150–400)
RBC: 3.54 MIL/uL — ABNORMAL LOW (ref 4.22–5.81)
RDW: 14.5 % (ref 11.5–15.5)
WBC: 12.8 10*3/uL — ABNORMAL HIGH (ref 4.0–10.5)
nRBC: 0 % (ref 0.0–0.2)

## 2021-04-15 LAB — BASIC METABOLIC PANEL
Anion gap: 10 (ref 5–15)
BUN: 13 mg/dL (ref 6–20)
CO2: 22 mmol/L (ref 22–32)
Calcium: 9 mg/dL (ref 8.9–10.3)
Chloride: 100 mmol/L (ref 98–111)
Creatinine, Ser: 0.78 mg/dL (ref 0.61–1.24)
GFR, Estimated: >60 mL/min (ref >60)
Glucose, Bld: 159 mg/dL — ABNORMAL HIGH (ref 70–99)
Potassium: 3.3 mmol/L — ABNORMAL LOW (ref 3.5–5.1)
Sodium: 132 mmol/L — ABNORMAL LOW (ref 135–145)

## 2021-04-15 MED ORDER — LEVETIRACETAM 750 MG PO TABS: 750.0000 mg | ORAL_TABLET | Freq: Two times a day (BID) | ORAL | 2 refills | Status: DC

## 2021-04-15 MED ORDER — POTASSIUM CHLORIDE CRYS ER 20 MEQ PO TBCR
40.0000 meq | EXTENDED_RELEASE_TABLET | Freq: Once | ORAL | Status: AC
  Administered 2021-04-15: 40 meq via ORAL
  Filled 2021-04-15: qty 2

## 2021-04-15 MED ORDER — SODIUM CHLORIDE 0.9 % IV BOLUS
1000.0000 mL | Freq: Once | INTRAVENOUS | Status: AC
  Administered 2021-04-15: 1000 mL via INTRAVENOUS

## 2021-04-15 MED ORDER — LEVETIRACETAM IN NACL 1500 MG/100ML IV SOLN
1500.0000 mg | Freq: Once | INTRAVENOUS | Status: AC
  Administered 2021-04-15: 1500 mg via INTRAVENOUS
  Filled 2021-04-15: qty 100

## 2021-04-15 NOTE — ED Triage Notes (Signed)
Pt via EMS from home. Pt was a friend outside, friend states that she went inside to answer the phone and came back out and pt was lying on the ground. Fire states on arrival pt was post ictal. Pt has a hx of seizure but does not know what medication he takes. Pt does not remember the incident. Unknown head injury. Pt is on Eliquis. Pt is A&OX4 and NAD on arrival.

## 2021-04-15 NOTE — ED Provider Notes (Signed)
[facility] Emergency Department Provider Note  ____________________________________________  Time seen: Approximately 10:29 PM  I have reviewed the triage vital signs and the nursing notes.   HISTORY  Chief Complaint Seizures    HPI Albert [name] is a 60 y.o. male with a history of hypertension, seizures who comes ED due to a seizure today.  Patient was at her friend's house outside, and while the friend had gone inside for a brief moment, the patient fell to the ground and became unconscious.  EMS report that patient was postictal on their arrival.  Currently he feels fine.  He denies headache vision change neck pain.  No recent illness, no acute symptoms, feels back to normal.  Patient notes that he does not take any seizure medicines currently.  Reviewed EMR, he supposed to be on Keppra and Lamictal.    Past Medical History:  Diagnosis Date   Hypertension    Scoliosis    Seizures [facility])      Patient Active Problem List   Diagnosis Date Noted   Atherosclerosis of native arteries of extremity with rest pain ([name]) 03/13/2021   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   Hypertension    Hypokalemia    Lumbar radiculopathy, chronic 02/18/2021   Seizure disorder ([name]) 12/05/2020   Tobacco dependence 12/05/2020     Past Surgical History:  Procedure Laterality Date   ABDOMINAL AORTOGRAM W/LOWER EXTREMITY N/A 03/08/2021   Procedure: ABDOMINAL AORTOGRAM W/LOWER EXTREMITY;  Surgeon: Annice Needy, MD;  Location: [facility] INVASIVE CV LAB;  Service: Cardiovascular;  Laterality: N/A;     Prior to Admission medications   Medication Sig Start Date End Date Taking? Authorizing Provider  acetaminophen (TYLENOL) 500 MG tablet Take 500 mg by mouth every 6 (six) hours as needed.    [provider]  amLODipine (NORVASC) 5 MG tablet Take 1 tablet (5 mg total) by mouth daily. 03/09/21   Enedina Finner, MD  apixaban (ELIQUIS) 5 MG TABS tablet Take 1 tablet (5 mg  total) by mouth 2 (two) times daily. Patient not taking: No sig reported 03/09/21   Enedina Finner, MD  aspirin EC 81 MG EC tablet Take 1 tablet (81 mg total) by mouth daily. Swallow whole. 03/09/21   Enedina Finner, MD  atorvastatin (LIPITOR) 20 MG tablet Take 4 tablets (80 mg total) by mouth daily. 03/09/21   Enedina Finner, MD  ibuprofen (ADVIL) 200 MG tablet Take 200 mg by mouth every 6 (six) hours as needed.    [provider]  lamoTRIgine (LAMICTAL) 100 MG tablet Take 100 mg by mouth in the morning and at bedtime. 11/15/20   [provider]  levETIRAcetam (KEPPRA) 750 MG tablet Take 1 tablet (750 mg total) by mouth 2 (two) times daily. 04/15/21 07/14/21  Sharman Cheek, MD  nicotine (NICODERM CQ - DOSED IN MG/24 HOURS) 21 mg/24hr patch Place 1 patch (21 mg total) onto the skin daily. 03/09/21   Enedina Finner, MD  oxyCODONE-acetaminophen (PERCOCET/ROXICET) 5-325 MG tablet Take 1 tablet by mouth every 8 (eight) hours as needed for severe pain. Do not drive after taking this medication Patient not taking: Reported on 03/12/2021 03/09/21   Enedina Finner, MD     Allergies Patient has no known allergies.   Family History  Problem Relation Age of Onset   Hypertension Father     Social History Social History   Tobacco Use   Smoking status: Former    Pack years: 0.00    Types: Cigarettes  Quit date: 03/05/2021    Years since quitting: 0.1   Smokeless tobacco: Never  Substance Use Topics   Alcohol use: Yes    Comment: occ   Drug use: Yes    Types: Marijuana    Review of Systems  Constitutional:   No fever or chills.  ENT:   No sore throat. No rhinorrhea. Cardiovascular:   No chest pain or syncope. Respiratory:   No dyspnea or cough. Gastrointestinal:   Negative for abdominal pain, vomiting and diarrhea.  Musculoskeletal:   Negative for focal pain or swelling All other systems reviewed and are negative except as documented above in ROS and  HPI.  ____________________________________________   PHYSICAL EXAM:  VITAL SIGNS: ED Triage Vitals  Enc Vitals Group     BP 04/15/21 1815 118/73     Pulse Rate 04/15/21 1815 83     Resp 04/15/21 1815 20     Temp 04/15/21 1815 98 F (36.7 C)     Temp Source 04/15/21 1815 Oral     SpO2 04/15/21 1815 97 %     Weight 04/15/21 1813 120 lb (54.4 kg)     Height 04/15/21 1813 5\' 7"  (1.702 m)     Head Circumference --      Peak Flow --      Pain Score 04/15/21 1813 0     Pain Loc --      Pain Edu? --      Excl. in GC? --     Vital signs reviewed, nursing assessments reviewed.   Constitutional:   Alert and oriented. Non-toxic appearance. Eyes:   Conjunctivae are normal. EOMI. PERRL. ENT      Head:   Normocephalic and atraumatic.      Nose:   Normal.      Mouth/Throat:   Normal, no intraoral injury.      Neck:   No meningismus. Full ROM. Hematological/Lymphatic/Immunilogical:   No cervical lymphadenopathy. Cardiovascular:   RRR. Symmetric bilateral radial and DP pulses.  No murmurs. Cap refill less than 2 seconds. Respiratory:   Normal respiratory effort without tachypnea/retractions. Breath sounds are clear and equal bilaterally. No wheezes/rales/rhonchi. Gastrointestinal:   Soft and nontender. Non distended. There is no CVA tenderness.  No rebound, rigidity, or guarding. Genitourinary:   deferred Musculoskeletal:   Normal range of motion in all extremities. No joint effusions.  No lower extremity tenderness.  No edema. Neurologic:   Normal speech and language.  Motor grossly intact. No acute focal neurologic deficits are appreciated.  Skin:    Skin is warm, dry and intact. No rash noted.  No petechiae, purpura, or bullae.  ____________________________________________    LABS (pertinent positives/negatives) (all labs ordered are listed, but only abnormal results are displayed) Labs Reviewed  BASIC METABOLIC PANEL - Abnormal; Notable for the following components:       Result Value   Sodium 132 (*)    Potassium 3.3 (*)    Glucose, Bld 159 (*)    All other components within normal limits  CBC WITH DIFFERENTIAL/PLATELET - Abnormal; Notable for the following components:   WBC 12.8 (*)    RBC 3.54 (*)    Hemoglobin 12.3 (*)    HCT 35.2 (*)    MCH 34.7 (*)    Neutro Abs 10.6 (*)    All other components within normal limits   ____________________________________________   EKG  Interpreted by me Sinus rhythm rate of 88, normal axis and intervals.  Poor R wave progression.  Normal  ST segments and T waves.  ____________________________________________    RADIOLOGY  CT Head Wo Contrast  Result Date: 04/15/2021 CLINICAL DATA:  Head trauma and coagulopathy. Patient found lying on ground, apparently postictal according the first responders. EXAM: CT HEAD WITHOUT CONTRAST TECHNIQUE: Contiguous axial images were obtained from the base of the skull through the vertex without intravenous contrast. 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 Otherwise, the brainstem, cerebellum, cerebral peduncles, thalamus, basal ganglia, basilar cisterns, and ventricular system appear within normal limits. No intracranial hemorrhage, mass lesion, or acute CVA. Vascular: There is atherosclerotic calcification of the cavernous carotid arteries bilaterally. Skull: Unremarkable Sinuses/Orbits: Subtotal opacification of the visualized portion of the right maxillary sinus likely from a large mucous retention cyst. Other: No supplemental non-categorized findings. IMPRESSION: 1. No acute intracranial findings. 2. Chronic right maxillary sinusitis. 3. 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 Electronically Signed   By: Gaylyn Rong M.D.   On: 04/15/2021 19:55     ____________________________________________   PROCEDURES Procedures  ____________________________________________  DIFFERENTIAL DIAGNOSIS   Intracranial hemorrhage, electrolyte abnormality, dehydration, epilepsy  CLINICAL IMPRESSION / ASSESSMENT AND PLAN / ED COURSE  Medications ordered in the ED: Medications  potassium chloride SA (KLOR-CON) CR tablet 40 mEq (40 mEq Oral Given 04/15/21 2010)  levETIRAcetam (KEPPRA) IVPB 1500 mg/ 100 mL premix (0 mg Intravenous Stopped 04/15/21 2054)  sodium chloride 0.9 % bolus 1,000 mL (0 mLs Intravenous Stopped 04/15/21 2139)    Pertinent labs & imaging results that were available during my care of the patient were reviewed by me and considered in my medical decision making (see chart for details).  Albert [name] was evaluated in Emergency Department on 04/15/2021 for the symptoms described in the history of present illness. He was evaluated in the context of the global COVID-19 pandemic, which necessitated consideration that the patient might be at risk for infection with the SARS-CoV-2 virus that causes COVID-19. Institutional protocols and algorithms that pertain to the evaluation of patients at risk for COVID-19 are in a state of rapid change based on information released by regulatory bodies including the CDC and federal and state organizations. These policies and algorithms were followed during the patient's care in the ED.   Patient presents with seizure, now asymptomatic.  Vital signs are normal, exam is benign, normal mental status is normal.  Likely due to medication noncompliance.  CT head obtained to evaluate for intracranial hemorrhage due to Eliquis use, negative for acute injury.  Labs are reassuring, patient is feeling good.  Given IV Keppra load, will prescribe Keppra.  Will avoid Lamictal for now since he has been noncompliant and starting stopping Lamictal can be problematic.       ____________________________________________   FINAL CLINICAL IMPRESSION(S) / ED DIAGNOSES    Final diagnoses:  Seizure [facility])     ED Discharge Orders          Ordered    levETIRAcetam (KEPPRA) 750 MG tablet  2 times daily        04/15/21 2228            Portions of this note were generated with dragon dictation software. Dictation errors may occur despite best attempts at proofreading.    Sharman Cheek, MD 04/15/21 2232

## 2021-04-15 NOTE — ED Notes (Signed)
This RN attempted PIV X 2 without success. Another RN to attempt.

## 2021-04-16 ENCOUNTER — Other Ambulatory Visit: Payer: Self-pay | Admitting: Internal Medicine

## 2021-04-16 DIAGNOSIS — I208 Other forms of angina pectoris: Secondary | ICD-10-CM

## 2021-04-16 DIAGNOSIS — Z01818 Encounter for other preprocedural examination: Secondary | ICD-10-CM

## 2021-04-24 ENCOUNTER — Other Ambulatory Visit: Payer: Self-pay

## 2021-04-24 ENCOUNTER — Ambulatory Visit: Payer: Medicaid Other | Admitting: Pharmacy Technician

## 2021-04-24 DIAGNOSIS — Z79899 Other long term (current) drug therapy: Secondary | ICD-10-CM

## 2021-04-25 ENCOUNTER — Other Ambulatory Visit: Payer: Self-pay

## 2021-04-25 NOTE — Progress Notes (Signed)
Completed [facility] application and contract.  Patient agreed to all terms of the [facility] contract.    Patient approved to receive medication assistance at [facility] until time for re-certification in 2023, and as long as eligibility criteria continues to be met.    Provided patient with community resource material based on his particular needs.    Albert [name] Care Manager [facility]  

## 2021-04-26 ENCOUNTER — Other Ambulatory Visit: Payer: Self-pay

## 2021-04-26 MED ORDER — AMLODIPINE BESYLATE 5 MG PO TABS
5.0000 mg | ORAL_TABLET | Freq: Every day | ORAL | 1 refills | Status: DC
  Filled 2021-04-26: qty 30, 30d supply, fill #0

## 2021-04-26 MED ORDER — ELIQUIS 5 MG PO TABS
5.0000 mg | ORAL_TABLET | Freq: Two times a day (BID) | ORAL | 3 refills | Status: DC
  Filled 2021-04-26: qty 60, 30d supply, fill #0

## 2021-04-26 MED ORDER — GABAPENTIN 100 MG PO CAPS
100.0000 mg | ORAL_CAPSULE | Freq: Three times a day (TID) | ORAL | 1 refills | Status: DC
  Filled 2021-04-26: qty 90, 30d supply, fill #0

## 2021-04-26 MED ORDER — ATORVASTATIN CALCIUM 80 MG PO TABS
80.0000 mg | ORAL_TABLET | Freq: Every day | ORAL | 1 refills | Status: DC
  Filled 2021-04-26: qty 30, 30d supply, fill #0

## 2021-04-26 MED ORDER — LEVETIRACETAM 250 MG PO TABS
750.0000 mg | ORAL_TABLET | Freq: Two times a day (BID) | ORAL | 1 refills | Status: DC
  Filled 2021-04-26: qty 180, 30d supply, fill #0

## 2021-04-29 ENCOUNTER — Other Ambulatory Visit: Payer: Self-pay

## 2021-04-29 MED ORDER — AMLODIPINE BESYLATE 5 MG PO TABS
5.0000 mg | ORAL_TABLET | Freq: Every day | ORAL | 3 refills | Status: DC
Start: 2021-03-10 — End: 2021-07-01
  Filled 2021-04-30 – 2021-05-22 (×2): qty 30, 30d supply, fill #0
  Filled 2021-06-20: qty 30, 30d supply, fill #1

## 2021-04-29 MED ORDER — ELIQUIS 5 MG PO TABS
5.0000 mg | ORAL_TABLET | Freq: Two times a day (BID) | ORAL | 3 refills | Status: DC
  Filled 2021-04-29: qty 60, 30d supply, fill #0
  Filled 2021-06-07: qty 60, 30d supply, fill #1
  Filled ????-??-??: fill #2

## 2021-04-29 MED ORDER — ATORVASTATIN CALCIUM 80 MG PO TABS
80.0000 mg | ORAL_TABLET | Freq: Every day | ORAL | 3 refills | Status: DC
  Filled 2021-05-22: qty 30, 30d supply, fill #0
  Filled 2021-06-20: qty 30, 30d supply, fill #1

## 2021-04-29 MED ORDER — LEVETIRACETAM 250 MG PO TABS
750.0000 mg | ORAL_TABLET | Freq: Two times a day (BID) | ORAL | 2 refills | Status: DC
  Filled 2021-05-22: qty 180, 30d supply, fill #0
  Filled 2021-06-20: qty 180, 30d supply, fill #1

## 2021-04-29 MED ORDER — GABAPENTIN 100 MG PO CAPS
100.0000 mg | ORAL_CAPSULE | Freq: Three times a day (TID) | ORAL | 1 refills | Status: DC
  Filled 2021-05-22: qty 90, 30d supply, fill #0
  Filled 2021-06-20: qty 90, 30d supply, fill #1

## 2021-04-30 ENCOUNTER — Ambulatory Visit: Admission: RE | Admit: 2021-04-30 | Payer: Self-pay | Source: Ambulatory Visit

## 2021-04-30 ENCOUNTER — Other Ambulatory Visit: Payer: Self-pay

## 2021-04-30 ENCOUNTER — Ambulatory Visit: Admission: RE | Admit: 2021-04-30 | Payer: Medicaid Other | Source: Ambulatory Visit

## 2021-04-30 ENCOUNTER — Telehealth: Payer: Self-pay | Admitting: Pharmacist

## 2021-04-30 NOTE — Telephone Encounter (Signed)
04/30/2021 10:59:31 AM - Eliquis to provider & pt  -- Rhetta Mura - Tuesday, April 30, 2021 10:57 AM --Received pharmacy printout for Eliquis 5mg  Take one tablet by mouth Two times a day, printed [facility] application-put patient portion in bag with meds for pat. to sign, also mailing provider Dr. @ [facility] his portion to sign & return.

## 2021-05-01 ENCOUNTER — Ambulatory Visit: Payer: Self-pay

## 2021-05-02 ENCOUNTER — Other Ambulatory Visit: Payer: Self-pay

## 2021-05-07 ENCOUNTER — Other Ambulatory Visit: Payer: Self-pay

## 2021-05-14 ENCOUNTER — Telehealth: Payer: Self-pay | Admitting: Pharmacist

## 2021-05-14 NOTE — Telephone Encounter (Signed)
05/14/2021 2:04:29 PM - Eliquis forms faxed to [facility]  -- Rhetta Mura - Tuesday, May 14, 2021 2:03 PM --Faxed [facility] application for Eliquis 5mg  Take 1 tablet by mouth two(2)  times a day.

## 2021-05-22 ENCOUNTER — Other Ambulatory Visit: Payer: Self-pay

## 2021-05-23 ENCOUNTER — Encounter
Admission: RE | Admit: 2021-05-23 | Discharge: 2021-05-23 | Disposition: A | Discharge status: Home / Self Care | Payer: Medicaid Other | Source: Ambulatory Visit | Attending: Internal Medicine | Admitting: Internal Medicine

## 2021-05-23 ENCOUNTER — Other Ambulatory Visit: Payer: Self-pay

## 2021-05-23 ENCOUNTER — Ambulatory Visit
Admission: RE | Admit: 2021-05-23 | Discharge: 2021-05-23 | Disposition: A | Discharge status: Home / Self Care | Payer: Medicaid Other | Source: Ambulatory Visit | Attending: Internal Medicine | Admitting: Internal Medicine

## 2021-05-23 DIAGNOSIS — Z01818 Encounter for other preprocedural examination: Secondary | ICD-10-CM | POA: Diagnosis not present

## 2021-05-23 DIAGNOSIS — I10 Essential (primary) hypertension: Secondary | ICD-10-CM | POA: Insufficient documentation

## 2021-05-23 DIAGNOSIS — I208 Other forms of angina pectoris: Secondary | ICD-10-CM

## 2021-05-23 DIAGNOSIS — Z01810 Encounter for preprocedural cardiovascular examination: Secondary | ICD-10-CM | POA: Insufficient documentation

## 2021-05-23 LAB — NM MYOCAR MULTI W/SPECT W/WALL MOTION / EF
Estimated workload: 1 METS
Exercise duration (min): 1 min
Exercise duration (sec): 0 s
LV dias vol: 52 mL (ref 62–150)
LV sys vol: 15 mL
MPHR: 94 {beats}/min
Peak HR: 94 {beats}/min
Percent HR: 58 %
Rest HR: 51 {beats}/min
SDS: 0
SRS: 0
SSS: 0
TID: 1

## 2021-05-23 LAB — ECHOCARDIOGRAM COMPLETE
AV Mean grad: 2 mmHg
AV Peak grad: 3.7 mmHg
Ao pk vel: 0.96 m/s
Area-P 1/2: 3.91 cm2
S' Lateral: 2.85 cm

## 2021-05-23 MED ORDER — REGADENOSON 0.4 MG/5ML IV SOLN
0.4000 mg | Freq: Once | INTRAVENOUS | Status: AC
  Administered 2021-05-23: 0.4 mg via INTRAVENOUS

## 2021-05-23 MED ORDER — TECHNETIUM TC 99M TETROFOSMIN IV KIT
29.7800 | PACK | Freq: Once | INTRAVENOUS | Status: AC | PRN
  Administered 2021-05-23: 29.78 via INTRAVENOUS

## 2021-05-23 MED ORDER — TECHNETIUM TC 99M TETROFOSMIN IV KIT
10.8200 | PACK | Freq: Once | INTRAVENOUS | Status: AC | PRN
  Administered 2021-05-23: 10.82 via INTRAVENOUS

## 2021-05-23 NOTE — Progress Notes (Signed)
*  PRELIMINARY RESULTS* Echocardiogram 2D Echocardiogram has been performed.  Albert [name] 05/23/2021, 9:25 AM

## 2021-05-24 ENCOUNTER — Other Ambulatory Visit: Payer: Self-pay

## 2021-06-07 ENCOUNTER — Other Ambulatory Visit: Payer: Self-pay

## 2021-06-11 ENCOUNTER — Other Ambulatory Visit: Payer: Self-pay

## 2021-06-20 ENCOUNTER — Other Ambulatory Visit: Payer: Self-pay

## 2021-06-21 ENCOUNTER — Encounter (INDEPENDENT_AMBULATORY_CARE_PROVIDER_SITE_OTHER): Payer: Self-pay

## 2021-06-21 ENCOUNTER — Telehealth (INDEPENDENT_AMBULATORY_CARE_PROVIDER_SITE_OTHER): Payer: Self-pay

## 2021-06-21 NOTE — Telephone Encounter (Signed)
Patient is schedule for Aorta bifemoral bypass with Dew/Schnier on 07/10/21. Pre-op is schedule for phone call between 8-1 pm and Covid-19 testing is for 10/3 between 8-12 pm Patient has been made aware and pre-surgical letter will be mailed.

## 2021-07-01 ENCOUNTER — Other Ambulatory Visit (INDEPENDENT_AMBULATORY_CARE_PROVIDER_SITE_OTHER): Payer: Self-pay | Admitting: Nurse Practitioner

## 2021-07-01 ENCOUNTER — Other Ambulatory Visit: Payer: Self-pay

## 2021-07-01 ENCOUNTER — Encounter
Admission: RE | Admit: 2021-07-01 | Discharge: 2021-07-01 | Disposition: A | Discharge status: Home / Self Care | Payer: Medicaid Other | Source: Ambulatory Visit | Attending: Vascular Surgery | Admitting: Vascular Surgery

## 2021-07-01 HISTORY — DX: Anxiety disorder, unspecified: F41.9

## 2021-07-01 HISTORY — DX: Depression, unspecified: F32.A

## 2021-07-01 NOTE — Patient Instructions (Addendum)
Blood work, EKG testing appointment:  Wednesday, September 28 at 1:00 pm [facility], pre-admission testing) Covid testing appointment:  Monday, October 3 between 8:00 am - 12:00 pm [facility], pre-admission testing)  Your surgery is scheduled on: Wednesday, October 5 That day report to the Medical Mall entrance; go to the 2nd floor, Surgery Information Desk To find out your arrival time, please call [phone] between 1PM - 3PM on: Tuesday, October 4  REMEMBER: Instructions that are not followed completely may result in serious medical risk, up to and including death; or upon the discretion of your surgeon and anesthesiologist your surgery may need to be rescheduled.  Do not eat food after midnight the night before surgery.  No gum chewing, lozengers or hard candies.  You may however, drink CLEAR liquids up to 2 hours before you are scheduled to arrive for your surgery. Do not drink anything within 2 hours of your scheduled arrival time.  Clear liquids include: - water  - apple juice without pulp - gatorade (not RED, PURPLE, OR BLUE) - black coffee or tea (Do NOT add milk or creamers to the coffee or tea) Do NOT drink anything that is not on this list.  TAKE THESE MEDICATIONS THE MORNING OF SURGERY WITH A SIP OF WATER:  Amlodipine Gabapentin Levetiracetam (Keppra)  Stop Eliquis 3 days prior to surgery. The last day to take Eliquis if Saturday, October 1. Resume Eliquis AFTER surgery per surgeon instruction.  Continue taking all other prescription medications.  One week prior to surgery: starting September 28 Stop aspirin and Anti-inflammatories (NSAIDS) such as Advil, Aleve, Ibuprofen, Motrin, Naproxen, Naprosyn and Aspirin based products such as Excedrin, Goodys Powder, BC Powder. Stop ANY OVER THE COUNTER supplements until after surgery. You may however, continue to take Tylenol if needed for pain up until the day of surgery.  No Alcohol for 24 hours before or after surgery.  No  Smoking including e-cigarettes for 24 hours prior to surgery.  No chewable tobacco products for at least 6 hours prior to surgery.  No nicotine patches on the day of surgery.  Do not use any "recreational" drugs for at least a week prior to your surgery.  Please be advised that the combination of cocaine and anesthesia may have negative outcomes, up to and including death. If you test positive for cocaine, your surgery will be cancelled.  On the morning of surgery brush your teeth with toothpaste and water, you may rinse your mouth with mouthwash if you wish. Do not swallow any toothpaste or mouthwash.  Use CHG Soap as directed on instruction sheet.  Do not wear jewelry, make-up, hairpins, clips or nail polish.  Do not wear lotions, powders, or perfumes.   Do not shave body from the neck down 48 hours prior to surgery just in case you cut yourself which could leave a site for infection.  Also, freshly shaved skin may become irritated if using the CHG soap.  Do not bring valuables to the hospital. [facility] is not responsible for any missing/lost belongings or valuables.   Notify your doctor if there is any change in your medical condition (cold, fever, infection).  Wear comfortable clothing (specific to your surgery type) to the hospital.  After surgery, you can help prevent lung complications by doing breathing exercises.  Take deep breaths and cough every 1-2 hours. Your doctor may order a device called an Incentive Spirometer to help you take deep breaths.  If you are being admitted to the hospital overnight,  leave your suitcase in the car. After surgery it may be brought to your room.  Please call the Pre-admissions Testing Dept. at [phone] if you have any questions about these instructions.  Surgery Visitation Policy:  Patients undergoing a surgery or procedure may have one family member or support person with them as long as that person is not COVID-19 positive or  experiencing its symptoms.  That person may remain in the waiting area during the procedure and may rotate out with other people.  Inpatient Visitation:    Visiting hours are 7 a.m. to 8 p.m. Up to two visitors ages 16+ are allowed at one time in a patient room. The visitors may rotate out with other people during the day. Visitors must check out when they leave, or other visitors will not be allowed. One designated support person may remain overnight. The visitor must pass COVID-19 screenings, use hand sanitizer when entering and exiting the patient's room and wear a mask at all times, including in the patient's room. Patients must also wear a mask when staff or their visitor are in the room. Masking is required regardless of vaccination status.

## 2021-07-02 ENCOUNTER — Encounter: Payer: Self-pay | Admitting: Vascular Surgery

## 2021-07-02 NOTE — Progress Notes (Signed)
Perioperative Services  Pre-Admission/Anesthesia Testing Clinical Review  Date: 07/05/21  Patient Demographics:  Name: Albert [name] DOB:   1960/12/29 MRN:   [id number]  Planned Surgical Procedure(s):    Case: [id number] Date/Time: 07/10/21 0815   Procedures:      BILATERAL AORTOBIFEMORAL BYPASS GRAFT     POSSIBLE ENDARTERECTOMY AORTIC AND RENAL     APPLICATION OF CELL SAVER   Anesthesia type: General   Pre-op diagnosis: ASO WITH REST PAIN   Location: [facility] OR ROOM 08 / [facility] ORS FOR ANESTHESIA GROUP   Surgeons: Annice Needy, MD   NOTE: Available PAT nursing documentation and vital signs have been reviewed. Clinical nursing staff has updated patient's PMH/PSHx, current medication list, and drug allergies/intolerances to ensure comprehensive history available to assist in medical decision making as it pertains to the aforementioned surgical procedure and anticipated anesthetic course. Extensive review of available clinical information performed. [place] [facility] and PSHx updated with any diagnoses/procedures that  may have been inadvertently omitted during his intake with the pre-admission testing department's nursing staff.  Clinical Discussion:  Albert [name] is a 60 y.o. male who is submitted for pre-surgical anesthesia review and clearance prior to him undergoing the above procedure. Patient is a Current Smoker. Pertinent PMH includes: angina at rest, PVD, aortic mural thrombus, lacunar infarction, HTN, HLD, SOB, lumbar DDD with radiculopathy, scoliosis, seizure disorder, anxiety, depression, daily marijuana use.   Patient is followed by cardiology Juliann Pares, MD). He was last seen in the cardiology clinic on 03/25/2021; notes reviewed.  At the time of his clinic visit, patient complaining of episodes of non-specific chest pain and shortness of breath.  He denied any PND, orthopnea, palpitations, significant peripheral edema, vertiginous symptoms, or presyncope/syncope.  Patient has no  definitive cardiac history, and was being seen and preoperative consult prior to major vascular surgery.  Blood pressure well controlled at 112/70 on currently prescribed CCB monotherapy.  Patient taking a statin for his HLD.  He is not diabetic.  Patient has a significant history of PVD.  He has required toe amputation in the past.  Currently, patient with a significant aortic mural thrombus.  He is on daily anticoagulation therapy (ASA + apixaban); compliant with therapy with no evidence of GI bleeding. Patient with history of seizure disorder (approximately 6 seizures per year); takes daily levetiracetam.  He is under the care of neurology Sherryll Burger, MD). Functional capacity, as defined by DASI, is documented as being >/= 4 METS.  Patient to follow-up with outpatient cardiology following noninvasive studies to discuss results and ongoing management.  Myocardial perfusion imaging study was performed on 05/23/2021 revealing hyperdynamic left ventricular ejection fraction of greater than 65%.  There was no evidence of stress-induced myocardial ischemia or arrhythmias.  Study determined to be normal and low risk.  Albert [name] is scheduled for AORTOBIFEMORAL BYPASS GRAFT and POSSIBLE AORTIC AND RENAL ENDARTERECTOMIES on 07/10/2021 with Dr. Festus Barren, MD.  Given patient's past medical history significant for cardiovascular and neurological diagnoses, presurgical clearances were sought from patient's primary cardiologist and neurologist.  Specialty clearances were obtained as follows.   Per neurology Sherryll Burger, MD), "patient last seen 11/2020.  Multiple no-shows and canceled appointments.  Patient should continue anti seizure medication during the perioperative period.  He may proceed with the planned surgical intervention at an overall MODERATE risk".  Per cardiology, "this patient is optimized for surgery and may proceed with the planned procedural course with a MODERATE risk of significant perioperative  cardiovascular complications".  Patient has been  instructed on recommendations from cardiology for holding both his ASA and apixaban for 5 days prior to his procedure with plans to restart since postoperatively Ms. ectomy minimized by his primary attending surgeon.  Patient is aware that his last dose of these medications will be on 07/04/2021.  Patient denies previous perioperative complications with anesthesia in the past.  In review his EMR, there are no records available for review pertaining to past procedural/anesthetic courses within the [facility] system.  Vitals with BMI 07/01/2021 04/15/2021 04/15/2021  Height 5\' 7"  - -  Weight 107 lbs - -  BMI 16.75 - -  Systolic - 133 131  Diastolic - 83 76  Pulse - 77 72    Providers/Specialists:   NOTE: Primary physician provider listed below. Patient may have been seen by APP or partner within same practice.   PROVIDER ROLE / SPECIALTY LAST OV  , MD VASCULAR SURGERY 03/12/2021  05/12/2021, MD PRIMARY CARE PROVIDER 02/18/2021  02/20/2021, MD CARDIOLOGY 03/25/2021  03/27/2021, MD NEUROLOGY 11/15/2020   Allergies:  Patient has no known allergies.  Current Home Medications:   No current facility-administered medications for this encounter.    acetaminophen (TYLENOL) 500 MG tablet   amLODipine (NORVASC) 5 MG tablet   apixaban (ELIQUIS) 5 MG TABS tablet   atorvastatin (LIPITOR) 80 MG tablet   gabapentin (NEURONTIN) 100 MG capsule   levETIRAcetam (KEPPRA) 250 MG tablet   Melatonin 10 MG CAPS   History:   Past Medical History:  Diagnosis Date   Angina at rest [facility])    Anxiety    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   Chronic anticoagulation    a.) ASA + apixaban   DDD (degenerative disc disease), lumbar    Depression    Diverticulosis    Dyshidrotic eczema    HLD  (hyperlipidemia)    Hypertension    Lacunar infarction [facility])    a.) MRI 05/18/2020 --> Old small vessell infarction in the RIGHT basal ganglia to corona radiography region. b.) CT head 04/15/2021 --> remote infarction along the upper margin of the RIGHT putamen common; unchanged.   Lumbar radiculopathy    Marijuana use (daily)    Nephrolithiasis    PVD (peripheral vascular disease) ([name])    Scoliosis    Seizures ([name])    a.) on daily levetiracetam. b.) last seizure 05/2021 (has about 6/year)   SOB (shortness of breath)    Testicular torsion    a.) age 76; required surgical reduction.   Past Surgical History:  Procedure Laterality Date   ABDOMINAL AORTOGRAM W/LOWER EXTREMITY N/A 03/08/2021   Procedure: ABDOMINAL AORTOGRAM W/LOWER EXTREMITY;  Surgeon: 05/08/2021, MD;  Location: [facility] INVASIVE CV LAB;  Service: Cardiovascular;  Laterality: N/A;   TESTICLE TORSION REDUCTION     age 55   Family History  Problem Relation Age of Onset   Hypertension Father    Social History   Tobacco Use   Smoking status: Every Day    Types: Cigarettes   Smokeless tobacco: Never  Vaping Use   Vaping Use: Former  Substance Use Topics   Alcohol use: Yes    Comment: daily   Drug use: Yes    Types: Marijuana    Comment: daily    Pertinent Clinical Results:  LABS: Labs reviewed: Acceptable for surgery.  Hospital Outpatient Visit on 07/03/2021  Component  Date Value Ref Range Status   WBC 07/03/2021 10.2  4.0 - 10.5 K/uL Final   RBC 07/03/2021 4.08 (A) 4.22 - 5.81 MIL/uL Final   Hemoglobin 07/03/2021 13.1  13.0 - 17.0 g/dL Final   HCT 27/25/3664 39.5  39.0 - 52.0 % Final   MCV 07/03/2021 96.8  80.0 - 100.0 fL Final   MCH 07/03/2021 32.1  26.0 - 34.0 pg Final   MCHC 07/03/2021 33.2  30.0 - 36.0 g/dL Final   RDW 40/34/7425 13.2  11.5 - 15.5 % Final   Platelets 07/03/2021 426 (A) 150 - 400 K/uL Final   nRBC 07/03/2021 0.0  0.0 - 0.2 % Final   Neutrophils Relative % 07/03/2021 68  % Final    Neutro Abs 07/03/2021 6.8  1.7 - 7.7 K/uL Final   Lymphocytes Relative 07/03/2021 22  % Final   Lymphs Abs 07/03/2021 2.2  0.7 - 4.0 K/uL Final   Monocytes Relative 07/03/2021 8  % Final   Monocytes Absolute 07/03/2021 0.9  0.1 - 1.0 K/uL Final   Eosinophils Relative 07/03/2021 1  % Final   Eosinophils Absolute 07/03/2021 0.1  0.0 - 0.5 K/uL Final   Basophils Relative 07/03/2021 1  % Final   Basophils Absolute 07/03/2021 0.1  0.0 - 0.1 K/uL Final   Immature Granulocytes 07/03/2021 0  % Final   Abs Immature Granulocytes 07/03/2021 0.04  0.00 - 0.07 K/uL Final   Performed at [facility], [address] [place], [place] [address]   Sodium 07/03/2021 137  135 - 145 mmol/L Final   Potassium 07/03/2021 4.1  3.5 - 5.1 mmol/L Final   HEMOLYSIS AT THIS LEVEL MAY AFFECT RESULT   Chloride 07/03/2021 102  98 - 111 mmol/L Final   CO2 07/03/2021 25  22 - 32 mmol/L Final   Glucose, Bld 07/03/2021 124 (A) 70 - 99 mg/dL Final   Glucose reference range applies only to samples taken after fasting for at least 8 hours.   BUN 07/03/2021 9  6 - 20 mg/dL Final   Creatinine, Ser 07/03/2021 0.70  0.61 - 1.24 mg/dL Final   Calcium 75/64/3329 9.1  8.9 - 10.3 mg/dL Final   GFR, Estimated 07/03/2021 >60  >60 mL/min Final   Comment: (NOTE) Calculated using the CKD-EPI Creatinine Equation (2021)    Anion gap 07/03/2021 10  5 - 15 Final   Performed at [facility], [address] [place], [place] [address]   ABO/RH(D) 07/03/2021 A POS   Final   Antibody Screen 07/03/2021 NEG   Final   Sample Expiration 07/03/2021 07/17/2021,2359   Final   Extend sample reason 07/03/2021    Final                   Value:NO TRANSFUSIONS OR PREGNANCY IN THE PAST 3 MONTHS Performed at [facility], [address] [place], [place] [address]    MRSA, PCR 07/03/2021 NEGATIVE  NEGATIVE Final   Staphylococcus aureus 07/03/2021 NEGATIVE  NEGATIVE Final   Comment: (NOTE) The Xpert SA Assay (FDA approved  for NASAL specimens in patients 58 years of age and older), is one component of a comprehensive surveillance program. It is not intended to diagnose infection nor to guide or monitor treatment. Performed at [facility], [address] [place], [place] [address]     ECG: Date: 07/05/2021 Time ECG obtained: 1300 PM Rate: 68 bpm Rhythm:  Sinus bradycardia with frequent PVCs Axis (leads I and aVF): Normal Intervals:  PR 142 ms. QRS 72 ms. QTc 435 ms. ST segment and T wave changes: No evidence of acute ST segment elevation or depression Comparison: Similar to previous tracing obtained on 04/15/2021   IMAGING / PROCEDURES: LEXISCAN performed on 05/23/2021 Left ventricular ejection fraction is hyperdynamic at greater than 65% No evidence of stress-induced myocardial ischemia or arrhythmia Blood pressure demonstrated normal response to exercise Normal low risk study  CTA AORTOBIFEMORAL WITH AND/OR WITHOUT CONTRAST performed on 03/06/2021 Significant mural thrombus within the infrarenal abdominal aorta with subsequent occlusion of the left common iliac artery at its origin. Irregular filling defect within the distal common iliac artery on the right extending into the proximal aspect of the external iliac artery on the right with high-grade stenosis identified. Occlusion of the right internal iliac artery at its origin is noted secondary to this filling defect. This is highly suspicious for underlying embolus. Runoff on the right shows atherosclerotic disease although 2 vessel runoff to the level of the right foot and subsequently into the foot is identified. The left common iliac artery occlusion extends to the level of the common femoral artery with significant reconstitution via the deep circumflex iliac artery and inferior epigastric arteries on the left. Some reconstitution of the distal aspect of the left internal iliac artery is noted although multifocal filling defects and  occlusive changes are seen again highly suspicious for emboli. Runoff on the left shows a tubular filling defect within the proximal superficial femoral artery which appears attached to the arterial wall in the distal common iliac artery. More distal runoff shows occlusion of the tibioperoneal trunk with minimal reconstitution of the peroneal and posterior tibial arteries in the distal left leg. The anterior tibial artery is patent proximally but demonstrates short segment occlusion in the mid to distal calf with reconstitution via muscular collaterals. More distal flow into the foot is not well visualized in part due to the multifocal occlusions. Correlation with Doppler pulses is recommended Cholelithiasis without complicating factors. Non-obstructing left renal stone as described. Diverticulosis without diverticulitis. Cystic changes in the kidneys bilaterally. These are stable from a prior MRI from October of 2021.  Impression and Plan:  Albert [name] has been referred for pre-anesthesia review and clearance prior to him undergoing the planned anesthetic and procedural courses. Available labs, pertinent testing, and imaging results were personally reviewed by me. This patient has been appropriately cleared by cardiology (MODERATE) and neurology (MODERATE) with the individually indicated risks of significant perioperative complications.  Based on clinical review performed today (07/05/21), barring any significant acute changes in the patient's overall condition, it is anticipated that he will be able to proceed with the planned surgical intervention. Any acute changes in clinical condition may necessitate his procedure being postponed and/or cancelled. Patient will meet with anesthesia team (MD and/or CRNA) on the day of his procedure for preoperative evaluation/assessment. Questions regarding anesthetic course will be fielded at that time.   Pre-surgical instructions were reviewed with the patient  during his PAT appointment and questions were fielded by PAT clinical staff. Patient was advised that if any questions or concerns arise prior to his procedure then he should return a call to PAT and/or his surgeon's office to discuss.  Quentin Mulling, MSN, APRN, FNP-C, CEN [facility]  Peri-operative Services Nurse Practitioner Phone: [phone] Fax: [phone] 07/05/21 7:42 AM  NOTE: This note has been prepared using Dragon dictation software. Despite my best ability to proofread, there is always the potential that unintentional transcriptional errors  may still occur from this process.

## 2021-07-03 ENCOUNTER — Encounter: Payer: Self-pay | Admitting: Urgent Care

## 2021-07-03 ENCOUNTER — Encounter
Admission: RE | Admit: 2021-07-03 | Discharge: 2021-07-03 | Disposition: A | Discharge status: Home / Self Care | Payer: Medicaid Other | Source: Ambulatory Visit | Attending: Vascular Surgery | Admitting: Vascular Surgery

## 2021-07-03 ENCOUNTER — Other Ambulatory Visit: Payer: Self-pay

## 2021-07-03 DIAGNOSIS — Z01810 Encounter for preprocedural cardiovascular examination: Secondary | ICD-10-CM

## 2021-07-03 DIAGNOSIS — Z01818 Encounter for other preprocedural examination: Secondary | ICD-10-CM | POA: Diagnosis not present

## 2021-07-03 LAB — BASIC METABOLIC PANEL
Anion gap: 10 (ref 5–15)
BUN: 9 mg/dL (ref 6–20)
CO2: 25 mmol/L (ref 22–32)
Calcium: 9.1 mg/dL (ref 8.9–10.3)
Chloride: 102 mmol/L (ref 98–111)
Creatinine, Ser: 0.7 mg/dL (ref 0.61–1.24)
GFR, Estimated: >60 mL/min (ref >60)
Glucose, Bld: 124 mg/dL — ABNORMAL HIGH (ref 70–99)
Potassium: 4.1 mmol/L (ref 3.5–5.1)
Sodium: 137 mmol/L (ref 135–145)

## 2021-07-03 LAB — CBC WITH DIFFERENTIAL/PLATELET
Abs Immature Granulocytes: 0.04 10*3/uL (ref 0.00–0.07)
Basophils Absolute: 0.1 10*3/uL (ref 0.0–0.1)
Basophils Relative: 1 %
Eosinophils Absolute: 0.1 10*3/uL (ref 0.0–0.5)
Eosinophils Relative: 1 %
HCT: 39.5 % (ref 39.0–52.0)
Hemoglobin: 13.1 g/dL (ref 13.0–17.0)
Immature Granulocytes: 0 %
Lymphocytes Relative: 22 %
Lymphs Abs: 2.2 10*3/uL (ref 0.7–4.0)
MCH: 32.1 pg (ref 26.0–34.0)
MCHC: 33.2 g/dL (ref 30.0–36.0)
MCV: 96.8 fL (ref 80.0–100.0)
Monocytes Absolute: 0.9 10*3/uL (ref 0.1–1.0)
Monocytes Relative: 8 %
Neutro Abs: 6.8 10*3/uL (ref 1.7–7.7)
Neutrophils Relative %: 68 %
Platelets: 426 10*3/uL — ABNORMAL HIGH (ref 150–400)
RBC: 4.08 MIL/uL — ABNORMAL LOW (ref 4.22–5.81)
RDW: 13.2 % (ref 11.5–15.5)
WBC: 10.2 10*3/uL (ref 4.0–10.5)
nRBC: 0 % (ref 0.0–0.2)

## 2021-07-03 LAB — SURGICAL PCR SCREEN
MRSA, PCR: NEGATIVE
Staphylococcus aureus: NEGATIVE

## 2021-07-08 ENCOUNTER — Other Ambulatory Visit: Payer: Self-pay

## 2021-07-08 ENCOUNTER — Other Ambulatory Visit
Admission: RE | Admit: 2021-07-08 | Discharge: 2021-07-08 | Disposition: A | Discharge status: Home / Self Care | Payer: Medicaid Other | Source: Ambulatory Visit | Attending: Vascular Surgery | Admitting: Vascular Surgery

## 2021-07-08 DIAGNOSIS — Z20822 Contact with and (suspected) exposure to covid-19: Secondary | ICD-10-CM | POA: Insufficient documentation

## 2021-07-08 DIAGNOSIS — Z01812 Encounter for preprocedural laboratory examination: Secondary | ICD-10-CM | POA: Diagnosis not present

## 2021-07-08 LAB — SARS CORONAVIRUS 2 (TAT 6-24 HRS): SARS Coronavirus 2: NEGATIVE

## 2021-07-10 ENCOUNTER — Other Ambulatory Visit: Payer: Self-pay

## 2021-07-10 ENCOUNTER — Inpatient Hospital Stay: Payer: Medicaid Other | Admitting: Urgent Care

## 2021-07-10 ENCOUNTER — Encounter: Payer: Self-pay | Admitting: Vascular Surgery

## 2021-07-10 ENCOUNTER — Inpatient Hospital Stay
Admission: RE | Admit: 2021-07-10 | Discharge: 2021-07-17 | DRG: 269 | Disposition: A | Discharge status: Home Health | Payer: Medicaid Other | Attending: Vascular Surgery | Admitting: Vascular Surgery

## 2021-07-10 ENCOUNTER — Inpatient Hospital Stay: Payer: Medicaid Other

## 2021-07-10 ENCOUNTER — Encounter
Admission: RE | Disposition: A | Discharge status: Home Health | Payer: Self-pay | Source: Home / Self Care | Attending: Vascular Surgery

## 2021-07-10 DIAGNOSIS — I70223 Atherosclerosis of native arteries of extremities with rest pain, bilateral legs: Secondary | ICD-10-CM | POA: Diagnosis present

## 2021-07-10 DIAGNOSIS — I700 Atherosclerosis of aorta: Secondary | ICD-10-CM | POA: Diagnosis present

## 2021-07-10 DIAGNOSIS — Z8249 Family history of ischemic heart disease and other diseases of the circulatory system: Secondary | ICD-10-CM

## 2021-07-10 DIAGNOSIS — I10 Essential (primary) hypertension: Secondary | ICD-10-CM | POA: Diagnosis present

## 2021-07-10 DIAGNOSIS — I493 Ventricular premature depolarization: Secondary | ICD-10-CM | POA: Diagnosis present

## 2021-07-10 DIAGNOSIS — F1290 Cannabis use, unspecified, uncomplicated: Secondary | ICD-10-CM | POA: Diagnosis present

## 2021-07-10 DIAGNOSIS — Z79899 Other long term (current) drug therapy: Secondary | ICD-10-CM

## 2021-07-10 DIAGNOSIS — Z7901 Long term (current) use of anticoagulants: Secondary | ICD-10-CM

## 2021-07-10 DIAGNOSIS — F32A Depression, unspecified: Secondary | ICD-10-CM | POA: Diagnosis present

## 2021-07-10 DIAGNOSIS — M419 Scoliosis, unspecified: Secondary | ICD-10-CM | POA: Diagnosis present

## 2021-07-10 DIAGNOSIS — Z79890 Hormone replacement therapy (postmenopausal): Secondary | ICD-10-CM

## 2021-07-10 DIAGNOSIS — F17210 Nicotine dependence, cigarettes, uncomplicated: Secondary | ICD-10-CM | POA: Diagnosis present

## 2021-07-10 DIAGNOSIS — T82856A Stenosis of peripheral vascular stent, initial encounter: Secondary | ICD-10-CM | POA: Diagnosis present

## 2021-07-10 DIAGNOSIS — I7409 Other arterial embolism and thrombosis of abdominal aorta: Secondary | ICD-10-CM | POA: Diagnosis present

## 2021-07-10 DIAGNOSIS — I513 Intracardiac thrombosis, not elsewhere classified: Secondary | ICD-10-CM | POA: Diagnosis present

## 2021-07-10 DIAGNOSIS — E785 Hyperlipidemia, unspecified: Secondary | ICD-10-CM | POA: Diagnosis present

## 2021-07-10 DIAGNOSIS — F419 Anxiety disorder, unspecified: Secondary | ICD-10-CM | POA: Diagnosis present

## 2021-07-10 DIAGNOSIS — Z0189 Encounter for other specified special examinations: Secondary | ICD-10-CM

## 2021-07-10 DIAGNOSIS — Y838 Other surgical procedures as the cause of abnormal reaction of the patient, or of later complication, without mention of misadventure at the time of the procedure: Secondary | ICD-10-CM | POA: Diagnosis present

## 2021-07-10 DIAGNOSIS — R710 Precipitous drop in hematocrit: Secondary | ICD-10-CM | POA: Diagnosis not present

## 2021-07-10 DIAGNOSIS — Z452 Encounter for adjustment and management of vascular access device: Secondary | ICD-10-CM

## 2021-07-10 DIAGNOSIS — G40909 Epilepsy, unspecified, not intractable, without status epilepticus: Secondary | ICD-10-CM | POA: Diagnosis present

## 2021-07-10 DIAGNOSIS — I70229 Atherosclerosis of native arteries of extremities with rest pain, unspecified extremity: Secondary | ICD-10-CM | POA: Diagnosis present

## 2021-07-10 DIAGNOSIS — I70213 Atherosclerosis of native arteries of extremities with intermittent claudication, bilateral legs: Secondary | ICD-10-CM | POA: Diagnosis not present

## 2021-07-10 DIAGNOSIS — Z8673 Personal history of transient ischemic attack (TIA), and cerebral infarction without residual deficits: Secondary | ICD-10-CM

## 2021-07-10 HISTORY — DX: Other cerebral infarction due to occlusion or stenosis of small artery: I63.81

## 2021-07-10 HISTORY — DX: Torsion of testis, unspecified: N44.00

## 2021-07-10 HISTORY — PX: CENTRAL VENOUS CATHETER INSERTION: SHX401

## 2021-07-10 HISTORY — DX: Other intervertebral disc degeneration, lumbar region without mention of lumbar back pain or lower extremity pain: M51.369

## 2021-07-10 HISTORY — DX: Other intervertebral disc degeneration, lumbar region: M51.36

## 2021-07-10 HISTORY — DX: Radiculopathy, lumbar region: M54.16

## 2021-07-10 HISTORY — PX: AORTA - BILATERAL FEMORAL ARTERY BYPASS GRAFT: SHX1175

## 2021-07-10 HISTORY — DX: Calculus of kidney: N20.0

## 2021-07-10 HISTORY — DX: Peripheral vascular disease, unspecified: I73.9

## 2021-07-10 HISTORY — DX: Hyperlipidemia, unspecified: E78.5

## 2021-07-10 HISTORY — DX: Long term (current) use of anticoagulants: Z79.01

## 2021-07-10 HISTORY — DX: Cannabis use, unspecified, uncomplicated: F12.90

## 2021-07-10 HISTORY — DX: Other forms of angina pectoris: I20.89

## 2021-07-10 HISTORY — DX: Diverticulosis of intestine, part unspecified, without perforation or abscess without bleeding: K57.90

## 2021-07-10 HISTORY — DX: Shortness of breath: R06.02

## 2021-07-10 HISTORY — DX: Dyshidrosis (pompholyx): L30.1

## 2021-07-10 HISTORY — DX: Other forms of angina pectoris: I20.8

## 2021-07-10 LAB — URINE DRUG SCREEN, QUALITATIVE (ARMC ONLY)
Amphetamines, Ur Screen: NOT DETECTED
Barbiturates, Ur Screen: NOT DETECTED
Benzodiazepine, Ur Scrn: NOT DETECTED
Cannabinoid 50 Ng, Ur ~~LOC~~: POSITIVE — AB
Cocaine Metabolite,Ur ~~LOC~~: NOT DETECTED
MDMA (Ecstasy)Ur Screen: NOT DETECTED
Methadone Scn, Ur: NOT DETECTED
Opiate, Ur Screen: NOT DETECTED
Phencyclidine (PCP) Ur S: NOT DETECTED
Tricyclic, Ur Screen: NOT DETECTED

## 2021-07-10 LAB — CBC
HCT: 44.7 % (ref 39.0–52.0)
Hemoglobin: 15.2 g/dL (ref 13.0–17.0)
MCH: 31.6 pg (ref 26.0–34.0)
MCHC: 34 g/dL (ref 30.0–36.0)
MCV: 92.9 fL (ref 80.0–100.0)
Platelets: 228 10*3/uL (ref 150–400)
RBC: 4.81 MIL/uL (ref 4.22–5.81)
RDW: 14.6 % (ref 11.5–15.5)
WBC: 22 10*3/uL — ABNORMAL HIGH (ref 4.0–10.5)
nRBC: 0 % (ref 0.0–0.2)

## 2021-07-10 LAB — ABO/RH: ABO/RH(D): A POS

## 2021-07-10 LAB — CREATININE, SERUM
Creatinine, Ser: 1.02 mg/dL (ref 0.61–1.24)
GFR, Estimated: >60 mL/min (ref >60)

## 2021-07-10 SURGERY — CREATION, BYPASS, ARTERIAL, AORTA TO FEMORAL, BILATERAL, USING GRAFT
Anesthesia: General | Site: Neck | Laterality: Right

## 2021-07-10 MED ORDER — HYDROMORPHONE HCL 1 MG/ML IJ SOLN
INTRAMUSCULAR | Status: DC | PRN
  Administered 2021-07-10: .75 mg via INTRAVENOUS
  Administered 2021-07-10: .25 mg via INTRAVENOUS

## 2021-07-10 MED ORDER — CEFAZOLIN SODIUM-DEXTROSE 2-4 GM/100ML-% IV SOLN
2.0000 g | INTRAVENOUS | Status: AC
  Administered 2021-07-10 (×2): 2 g via INTRAVENOUS

## 2021-07-10 MED ORDER — MANNITOL 20 % IV SOLN: INTRAVENOUS | Status: DC | PRN

## 2021-07-10 MED ORDER — SODIUM CHLORIDE 0.9 % IV SOLN: INTRAVENOUS | Status: AC

## 2021-07-10 MED ORDER — HYDROMORPHONE HCL 1 MG/ML IJ SOLN
INTRAMUSCULAR | Status: AC
  Filled 2021-07-10: qty 1

## 2021-07-10 MED ORDER — SENNOSIDES-DOCUSATE SODIUM 8.6-50 MG PO TABS: 1.0000 | ORAL_TABLET | Freq: Every evening | ORAL | Status: DC | PRN

## 2021-07-10 MED ORDER — CHLORHEXIDINE GLUCONATE CLOTH 2 % EX PADS: 6.0000 | MEDICATED_PAD | Freq: Once | CUTANEOUS | Status: DC

## 2021-07-10 MED ORDER — HYDRALAZINE HCL 20 MG/ML IJ SOLN: 5.0000 mg | INTRAMUSCULAR | Status: DC | PRN

## 2021-07-10 MED ORDER — SODIUM CHLORIDE 0.9 % IV SOLN: 500.0000 mL | Freq: Once | INTRAVENOUS | Status: DC | PRN

## 2021-07-10 MED ORDER — ORAL CARE MOUTH RINSE: 15.0000 mL | Freq: Once | OROMUCOSAL | Status: AC

## 2021-07-10 MED ORDER — METOPROLOL TARTRATE 5 MG/5ML IV SOLN: 2.0000 mg | INTRAVENOUS | Status: DC | PRN

## 2021-07-10 MED ORDER — FENTANYL CITRATE (PF) 100 MCG/2ML IJ SOLN
25.0000 ug | INTRAMUSCULAR | Status: DC | PRN
  Administered 2021-07-10: 25 ug via INTRAVENOUS

## 2021-07-10 MED ORDER — ATORVASTATIN CALCIUM 20 MG PO TABS
80.0000 mg | ORAL_TABLET | Freq: Every day | ORAL | Status: DC
  Administered 2021-07-11 – 2021-07-15 (×5): 80 mg
  Filled 2021-07-10 (×3): qty 4
  Filled 2021-07-10 (×2): qty 1
  Filled 2021-07-10 (×2): qty 4

## 2021-07-10 MED ORDER — OXYCODONE-ACETAMINOPHEN 5-325 MG PO TABS: 1.0000 | ORAL_TABLET | ORAL | Status: DC | PRN

## 2021-07-10 MED ORDER — ENOXAPARIN SODIUM 40 MG/0.4ML IJ SOSY
40.0000 mg | PREFILLED_SYRINGE | INTRAMUSCULAR | Status: DC
  Administered 2021-07-11 – 2021-07-17 (×7): 40 mg via SUBCUTANEOUS
  Filled 2021-07-10 (×7): qty 0.4

## 2021-07-10 MED ORDER — VISTASEAL 10 ML SINGLE DOSE KIT
PACK | CUTANEOUS | Status: DC | PRN
  Administered 2021-07-10: 10 mL via TOPICAL

## 2021-07-10 MED ORDER — LABETALOL HCL 5 MG/ML IV SOLN
10.0000 mg | INTRAVENOUS | Status: DC | PRN
Start: 2021-07-10 — End: 2021-07-17

## 2021-07-10 MED ORDER — VISTASEAL 10 ML SINGLE DOSE KIT
PACK | CUTANEOUS | Status: AC
  Filled 2021-07-10: qty 10

## 2021-07-10 MED ORDER — GABAPENTIN 100 MG PO CAPS: 100.0000 mg | ORAL_CAPSULE | Freq: Three times a day (TID) | ORAL | Status: DC

## 2021-07-10 MED ORDER — HEPARIN SODIUM (PORCINE) 5000 UNIT/ML IJ SOLN
INTRAMUSCULAR | Status: AC
  Filled 2021-07-10: qty 1

## 2021-07-10 MED ORDER — MIDAZOLAM HCL 2 MG/2ML IJ SOLN
INTRAMUSCULAR | Status: DC | PRN
  Administered 2021-07-10 (×2): 1 mg via INTRAVENOUS

## 2021-07-10 MED ORDER — FENTANYL CITRATE (PF) 100 MCG/2ML IJ SOLN
INTRAMUSCULAR | Status: AC
  Filled 2021-07-10: qty 2

## 2021-07-10 MED ORDER — ACETAMINOPHEN 325 MG PO TABS: 325.0000 mg | ORAL_TABLET | ORAL | Status: DC | PRN

## 2021-07-10 MED ORDER — PHENYLEPHRINE HCL (PRESSORS) 10 MG/ML IV SOLN
INTRAVENOUS | Status: DC | PRN
Start: 2021-07-10 — End: 2021-07-10
  Administered 2021-07-10: 100 ug via INTRAVENOUS
  Administered 2021-07-10 (×3): 200 ug via INTRAVENOUS
  Administered 2021-07-10 (×3): 100 ug via INTRAVENOUS

## 2021-07-10 MED ORDER — SODIUM CHLORIDE 0.9 % IR SOLN
Status: DC | PRN
  Administered 2021-07-10: 500 mL

## 2021-07-10 MED ORDER — SUGAMMADEX SODIUM 500 MG/5ML IV SOLN
INTRAVENOUS | Status: DC | PRN
Start: 2021-07-10 — End: 2021-07-10
  Administered 2021-07-10: 400 mg via INTRAVENOUS

## 2021-07-10 MED ORDER — ALBUMIN HUMAN 5 % IV SOLN: INTRAVENOUS | Status: DC | PRN

## 2021-07-10 MED ORDER — ACETAMINOPHEN 10 MG/ML IV SOLN
INTRAVENOUS | Status: DC | PRN
  Administered 2021-07-10: 1000 mg via INTRAVENOUS

## 2021-07-10 MED ORDER — SODIUM CHLORIDE FLUSH 0.9 % IV SOLN
INTRAVENOUS | Status: AC
  Filled 2021-07-10: qty 30

## 2021-07-10 MED ORDER — PHENYLEPHRINE HCL (PRESSORS) 10 MG/ML IV SOLN
INTRAVENOUS | Status: AC
  Filled 2021-07-10: qty 1

## 2021-07-10 MED ORDER — DEXAMETHASONE SODIUM PHOSPHATE 10 MG/ML IJ SOLN
INTRAMUSCULAR | Status: DC | PRN
  Administered 2021-07-10: 10 mg via INTRAVENOUS

## 2021-07-10 MED ORDER — EPHEDRINE SULFATE 50 MG/ML IJ SOLN
INTRAMUSCULAR | Status: DC | PRN
  Administered 2021-07-10 (×7): 5 mg via INTRAVENOUS
  Administered 2021-07-10: 10 mg via INTRAVENOUS

## 2021-07-10 MED ORDER — LIDOCAINE HCL (CARDIAC) PF 100 MG/5ML IV SOSY
PREFILLED_SYRINGE | INTRAVENOUS | Status: DC | PRN
Start: 2021-07-10 — End: 2021-07-10
  Administered 2021-07-10: 50 mg via INTRAVENOUS

## 2021-07-10 MED ORDER — ONDANSETRON HCL 4 MG/2ML IJ SOLN: 4.0000 mg | Freq: Once | INTRAMUSCULAR | Status: DC | PRN

## 2021-07-10 MED ORDER — ASPIRIN EC 81 MG PO TBEC: 81.0000 mg | DELAYED_RELEASE_TABLET | Freq: Every day | ORAL | Status: DC

## 2021-07-10 MED ORDER — SODIUM CHLORIDE 0.9% FLUSH: 10.0000 mL | INTRAVENOUS | Status: DC | PRN

## 2021-07-10 MED ORDER — HEMOSTATIC AGENTS (NO CHARGE) OPTIME
TOPICAL | Status: DC | PRN
  Administered 2021-07-10 (×2): 2 via TOPICAL

## 2021-07-10 MED ORDER — VASOPRESSIN 20 UNIT/ML IV SOLN
INTRAVENOUS | Status: DC | PRN
  Administered 2021-07-10: 1 [IU] via INTRAVENOUS
  Administered 2021-07-10: 2 [IU] via INTRAVENOUS
  Administered 2021-07-10: 1 [IU] via INTRAVENOUS
  Administered 2021-07-10: 2 [IU] via INTRAVENOUS
  Administered 2021-07-10 (×2): 1 [IU] via INTRAVENOUS
  Administered 2021-07-10: 2 [IU] via INTRAVENOUS
  Administered 2021-07-10: .5 [IU] via INTRAVENOUS
  Administered 2021-07-10: 2 [IU] via INTRAVENOUS
  Administered 2021-07-10: .5 [IU] via INTRAVENOUS

## 2021-07-10 MED ORDER — ROCURONIUM BROMIDE 100 MG/10ML IV SOLN
INTRAVENOUS | Status: DC | PRN
  Administered 2021-07-10: 50 mg via INTRAVENOUS
  Administered 2021-07-10: 40 mg via INTRAVENOUS
  Administered 2021-07-10: 60 mg via INTRAVENOUS

## 2021-07-10 MED ORDER — FIBRIN SEALANT 2 ML SINGLE DOSE KIT
PACK | CUTANEOUS | Status: DC | PRN
  Administered 2021-07-10: 2 mL via TOPICAL

## 2021-07-10 MED ORDER — PHENOL 1.4 % MT LIQD
1.0000 | OROMUCOSAL | Status: DC | PRN
  Administered 2021-07-11 – 2021-07-12 (×2): 1 via OROMUCOSAL
  Filled 2021-07-10 (×2): qty 177

## 2021-07-10 MED ORDER — AMLODIPINE BESYLATE 5 MG PO TABS
5.0000 mg | ORAL_TABLET | Freq: Every day | ORAL | Status: DC
  Administered 2021-07-11 – 2021-07-15 (×5): 5 mg
  Filled 2021-07-10 (×5): qty 1

## 2021-07-10 MED ORDER — MANNITOL 25 % IV SOLN
INTRAVENOUS | Status: DC | PRN
  Administered 2021-07-10 (×2): 12.5 g via INTRAVENOUS

## 2021-07-10 MED ORDER — LEVETIRACETAM 750 MG PO TABS
750.0000 mg | ORAL_TABLET | Freq: Two times a day (BID) | ORAL | Status: DC
  Filled 2021-07-10: qty 1

## 2021-07-10 MED ORDER — CHLORHEXIDINE GLUCONATE 0.12 % MT SOLN
OROMUCOSAL | Status: AC
  Administered 2021-07-10: 15 mL via OROMUCOSAL
  Filled 2021-07-10: qty 15

## 2021-07-10 MED ORDER — NITROGLYCERIN IN D5W 200-5 MCG/ML-% IV SOLN
5.0000 ug/min | INTRAVENOUS | Status: DC
Start: 2021-07-10 — End: 2021-07-11

## 2021-07-10 MED ORDER — GUAIFENESIN-DM 100-10 MG/5ML PO SYRP: 15.0000 mL | ORAL_SOLUTION | ORAL | Status: DC | PRN

## 2021-07-10 MED ORDER — SODIUM CHLORIDE 0.9 % IV SOLN
750.0000 mg | Freq: Two times a day (BID) | INTRAVENOUS | Status: DC
  Administered 2021-07-10 – 2021-07-14 (×9): 750 mg via INTRAVENOUS
  Filled 2021-07-10 (×10): qty 7.5

## 2021-07-10 MED ORDER — SODIUM CHLORIDE 0.9 % IV SOLN: INTRAVENOUS | Status: DC | PRN

## 2021-07-10 MED ORDER — ONDANSETRON HCL 4 MG/2ML IJ SOLN
4.0000 mg | Freq: Four times a day (QID) | INTRAMUSCULAR | Status: DC | PRN
  Filled 2021-07-10: qty 2

## 2021-07-10 MED ORDER — CEFAZOLIN SODIUM-DEXTROSE 2-4 GM/100ML-% IV SOLN
2.0000 g | Freq: Three times a day (TID) | INTRAVENOUS | Status: AC
Start: 2021-07-10 — End: 2021-07-11
  Administered 2021-07-10: 2 g via INTRAVENOUS
  Filled 2021-07-10 (×2): qty 100

## 2021-07-10 MED ORDER — SORBITOL 70 % SOLN
30.0000 mL | Freq: Every day | Status: DC | PRN
  Administered 2021-07-14: 30 mL
  Filled 2021-07-10 (×2): qty 30

## 2021-07-10 MED ORDER — LACTATED RINGERS IV SOLN: INTRAVENOUS | Status: DC

## 2021-07-10 MED ORDER — OXYCODONE HCL 5 MG PO TABS: 5.0000 mg | ORAL_TABLET | Freq: Once | ORAL | Status: DC | PRN

## 2021-07-10 MED ORDER — OXYCODONE HCL 5 MG/5ML PO SOLN
5.0000 mg | Freq: Once | ORAL | Status: DC | PRN
Start: 2021-07-10 — End: 2021-07-10

## 2021-07-10 MED ORDER — CHLORHEXIDINE GLUCONATE 0.12 % MT SOLN: 15.0000 mL | Freq: Once | OROMUCOSAL | Status: AC

## 2021-07-10 MED ORDER — PROPOFOL 10 MG/ML IV BOLUS
INTRAVENOUS | Status: DC | PRN
  Administered 2021-07-10: 80 mg via INTRAVENOUS
  Administered 2021-07-10: 20 mg via INTRAVENOUS

## 2021-07-10 MED ORDER — FENTANYL CITRATE (PF) 100 MCG/2ML IJ SOLN
INTRAMUSCULAR | Status: AC
  Administered 2021-07-10: 25 ug via INTRAVENOUS
  Filled 2021-07-10: qty 2

## 2021-07-10 MED ORDER — MAGNESIUM SULFATE 2 GM/50ML IV SOLN
2.0000 g | Freq: Every day | INTRAVENOUS | Status: DC | PRN
Start: 2021-07-10 — End: 2021-07-17
  Filled 2021-07-10 (×2): qty 50

## 2021-07-10 MED ORDER — HEPARIN 30,000 UNITS/1000 ML (OHS) CELLSAVER SOLUTION
Status: AC | PRN
  Administered 2021-07-10: 1

## 2021-07-10 MED ORDER — FENTANYL CITRATE (PF) 100 MCG/2ML IJ SOLN
INTRAMUSCULAR | Status: DC | PRN
  Administered 2021-07-10 (×2): 50 ug via INTRAVENOUS
  Administered 2021-07-10: 100 ug via INTRAVENOUS

## 2021-07-10 MED ORDER — DOCUSATE SODIUM 100 MG PO CAPS: 100.0000 mg | ORAL_CAPSULE | Freq: Every day | ORAL | Status: DC

## 2021-07-10 MED ORDER — 0.9 % SODIUM CHLORIDE (POUR BTL) OPTIME
TOPICAL | Status: DC | PRN
  Administered 2021-07-10: 1000 mL

## 2021-07-10 MED ORDER — SODIUM CHLORIDE 0.9 % IV SOLN
INTRAVENOUS | Status: DC | PRN
  Administered 2021-07-10: 25 ug/min via INTRAVENOUS

## 2021-07-10 MED ORDER — MORPHINE SULFATE (PF) 2 MG/ML IV SOLN
2.0000 mg | INTRAVENOUS | Status: DC | PRN
  Administered 2021-07-10: 3 mg via INTRAVENOUS
  Administered 2021-07-11: 2 mg via INTRAVENOUS
  Administered 2021-07-11: 4 mg via INTRAVENOUS
  Administered 2021-07-11 – 2021-07-14 (×8): 2 mg via INTRAVENOUS
  Administered 2021-07-14: 4 mg via INTRAVENOUS
  Administered 2021-07-15: 2 mg via INTRAVENOUS
  Filled 2021-07-10: qty 2
  Filled 2021-07-10 (×4): qty 1
  Filled 2021-07-10: qty 2
  Filled 2021-07-10 (×6): qty 1
  Filled 2021-07-10: qty 2

## 2021-07-10 MED ORDER — CHLORHEXIDINE GLUCONATE CLOTH 2 % EX PADS
6.0000 | MEDICATED_PAD | Freq: Every day | CUTANEOUS | Status: DC
  Administered 2021-07-10 – 2021-07-12 (×2): 6 via TOPICAL

## 2021-07-10 MED ORDER — ALUM & MAG HYDROXIDE-SIMETH 200-200-20 MG/5ML PO SUSP: 15.0000 mL | ORAL | Status: DC | PRN

## 2021-07-10 MED ORDER — POTASSIUM CHLORIDE CRYS ER 20 MEQ PO TBCR: 20.0000 meq | EXTENDED_RELEASE_TABLET | Freq: Every day | ORAL | Status: DC | PRN

## 2021-07-10 MED ORDER — AMLODIPINE BESYLATE 5 MG PO TABS: 5.0000 mg | ORAL_TABLET | Freq: Every day | ORAL | Status: DC

## 2021-07-10 MED ORDER — ONDANSETRON HCL 4 MG/2ML IJ SOLN
4.0000 mg | Freq: Four times a day (QID) | INTRAMUSCULAR | Status: DC | PRN
  Administered 2021-07-10 (×3): 4 mg via INTRAVENOUS

## 2021-07-10 MED ORDER — SODIUM CHLORIDE 0.9% FLUSH
10.0000 mL | Freq: Two times a day (BID) | INTRAVENOUS | Status: DC
  Administered 2021-07-10: 40 mL
  Administered 2021-07-11 – 2021-07-17 (×13): 10 mL

## 2021-07-10 MED ORDER — FAMOTIDINE IN NACL 20-0.9 MG/50ML-% IV SOLN
20.0000 mg | Freq: Two times a day (BID) | INTRAVENOUS | Status: DC
  Administered 2021-07-10 – 2021-07-13 (×6): 20 mg via INTRAVENOUS
  Filled 2021-07-10 (×7): qty 50

## 2021-07-10 MED ORDER — METOPROLOL TARTRATE 5 MG/5ML IV SOLN
INTRAVENOUS | Status: DC | PRN
  Administered 2021-07-10 (×2): 1 mg via INTRAVENOUS

## 2021-07-10 MED ORDER — ACETAMINOPHEN 500 MG PO TABS: 500.0000 mg | ORAL_TABLET | Freq: Four times a day (QID) | ORAL | Status: DC | PRN

## 2021-07-10 MED ORDER — CEFAZOLIN SODIUM-DEXTROSE 2-4 GM/100ML-% IV SOLN
INTRAVENOUS | Status: AC
  Administered 2021-07-11: 2 g via INTRAVENOUS
  Filled 2021-07-10: qty 100

## 2021-07-10 MED ORDER — ATORVASTATIN CALCIUM 80 MG PO TABS
80.0000 mg | ORAL_TABLET | Freq: Every day | ORAL | Status: DC
  Filled 2021-07-10: qty 1

## 2021-07-10 MED ORDER — SORBITOL 70 % SOLN
30.0000 mL | Freq: Every day | Status: DC | PRN
  Filled 2021-07-10: qty 30

## 2021-07-10 MED ORDER — ACETAMINOPHEN 650 MG RE SUPP
325.0000 mg | RECTAL | Status: DC | PRN
  Filled 2021-07-10: qty 1

## 2021-07-10 MED ORDER — ALBUMIN HUMAN 5 % IV SOLN
INTRAVENOUS | Status: AC
  Filled 2021-07-10: qty 500

## 2021-07-10 MED ORDER — HEPARIN SODIUM (PORCINE) 1000 UNIT/ML IJ SOLN
INTRAMUSCULAR | Status: DC | PRN
  Administered 2021-07-10: 3000 [IU] via INTRAVENOUS
  Administered 2021-07-10: 5000 [IU] via INTRAVENOUS

## 2021-07-10 MED ORDER — FAMOTIDINE 20 MG PO TABS
ORAL_TABLET | ORAL | Status: AC
  Administered 2021-07-10: 20 mg via ORAL
  Filled 2021-07-10: qty 1

## 2021-07-10 MED ORDER — OXYCODONE-ACETAMINOPHEN 5-325 MG PO TABS
1.0000 | ORAL_TABLET | ORAL | Status: DC | PRN
  Administered 2021-07-12: 1
  Administered 2021-07-12 – 2021-07-17 (×18): 2
  Filled 2021-07-10 (×17): qty 2
  Filled 2021-07-10: qty 1
  Filled 2021-07-10: qty 2

## 2021-07-10 MED ORDER — DOPAMINE-DEXTROSE 3.2-5 MG/ML-% IV SOLN: 5.0000 ug/kg/min | INTRAVENOUS | Status: DC

## 2021-07-10 MED ORDER — SENNOSIDES-DOCUSATE SODIUM 8.6-50 MG PO TABS
1.0000 | ORAL_TABLET | Freq: Every evening | ORAL | Status: DC | PRN
  Administered 2021-07-14: 1
  Filled 2021-07-10: qty 1

## 2021-07-10 MED ORDER — FAMOTIDINE 20 MG PO TABS: 20.0000 mg | ORAL_TABLET | Freq: Once | ORAL | Status: AC

## 2021-07-10 MED ORDER — MIDAZOLAM HCL 2 MG/2ML IJ SOLN
INTRAMUSCULAR | Status: AC
  Filled 2021-07-10: qty 2

## 2021-07-10 MED ORDER — HYDROMORPHONE HCL 1 MG/ML IJ SOLN
1.0000 mg | Freq: Once | INTRAMUSCULAR | Status: AC | PRN
  Administered 2021-07-10: 1 mg via INTRAVENOUS
  Filled 2021-07-10: qty 1

## 2021-07-10 SURGICAL SUPPLY — 91 items
APPLIER CLIP 11 MED OPEN (CLIP)
APPLIER CLIP 13 LRG OPEN (CLIP)
APPLIER CLIP 9.375 SM OPEN (CLIP)
BAG COUNTER SPONGE SURGICOUNT (BAG) ×8 IMPLANT
BAG DECANTER FOR FLEXI CONT (MISCELLANEOUS) ×4 IMPLANT
BLADE SURG 15 STRL LF DISP TIS (BLADE) ×3 IMPLANT
BLADE SURG 15 STRL SS (BLADE) ×1
BLADE SURG SZ10 CARB STEEL (BLADE) ×4 IMPLANT
BLADE SURG SZ11 CARB STEEL (BLADE) ×4 IMPLANT
BOOT SUTURE AID YELLOW STND (SUTURE) ×8 IMPLANT
BRUSH SCRUB EZ  4% CHG (MISCELLANEOUS) ×1
BRUSH SCRUB EZ 4% CHG (MISCELLANEOUS) ×3 IMPLANT
CHLORAPREP W/TINT 26 (MISCELLANEOUS) ×8 IMPLANT
CLIP APPLIE 11 MED OPEN (CLIP) IMPLANT
CLIP APPLIE 13 LRG OPEN (CLIP) IMPLANT
CLIP APPLIE 9.375 SM OPEN (CLIP) IMPLANT
COVER BACK TABLE REUSABLE LG (DRAPES) ×4 IMPLANT
COVER PROBE FLX POLY STRL (MISCELLANEOUS) ×4 IMPLANT
DRAPE INCISE IOBAN 66X45 STRL (DRAPES) ×4 IMPLANT
DRAPE INCISE IOBAN 66X60 STRL (DRAPES) ×8 IMPLANT
DRAPE MAG INST 16X20 L/F (DRAPES) IMPLANT
DRESSING SURGICEL FIBRLLR 1X2 (HEMOSTASIS) ×6 IMPLANT
DRSG OPSITE POSTOP 4X14 (GAUZE/BANDAGES/DRESSINGS) ×4 IMPLANT
DRSG OPSITE POSTOP 4X6 (GAUZE/BANDAGES/DRESSINGS) ×4 IMPLANT
DRSG SURGICEL FIBRILLAR 1X2 (HEMOSTASIS) ×8
ELECT CAUTERY BLADE 6.4 (BLADE) ×8 IMPLANT
ELECT REM PT RETURN 9FT ADLT (ELECTROSURGICAL) ×8
ELECTRODE REM PT RTRN 9FT ADLT (ELECTROSURGICAL) ×6 IMPLANT
GAUZE 4X4 16PLY ~~LOC~~+RFID DBL (SPONGE) ×4 IMPLANT
GAUZE SPONGE 4X4 12PLY STRL (GAUZE/BANDAGES/DRESSINGS) ×4 IMPLANT
GLOVE SURG ENC MOIS LTX SZ7 (GLOVE) ×4 IMPLANT
GLOVE SURG SYN 7.0 (GLOVE) ×12 IMPLANT
GLOVE SURG SYN 8.0 (GLOVE) ×8 IMPLANT
GLOVE SURG UNDER LTX SZ7.5 (GLOVE) ×4 IMPLANT
GOWN STRL REUS W/ TWL LRG LVL3 (GOWN DISPOSABLE) ×6 IMPLANT
GOWN STRL REUS W/ TWL XL LVL3 (GOWN DISPOSABLE) ×12 IMPLANT
GOWN STRL REUS W/TWL LRG LVL3 (GOWN DISPOSABLE) ×2
GOWN STRL REUS W/TWL XL LVL3 (GOWN DISPOSABLE) ×4
GRAFT VASC BIF 14X7X40 (Graft) ×4 IMPLANT
HANDLE YANKAUER SUCT BULB TIP (MISCELLANEOUS) ×12 IMPLANT
HEMOSTAT SURGICEL 2X14 (HEMOSTASIS) ×4 IMPLANT
IV CONNECTOR ONE LINK NDLESS (IV SETS) ×12 IMPLANT
KIT CV MULTILUMEN 7FR 20 (SET/KITS/TRAYS/PACK) ×4
KIT CV MULTILUMEN 7FR 20 SUB (SET/KITS/TRAYS/PACK) ×3 IMPLANT
KIT TURNOVER KIT A (KITS) ×4 IMPLANT
LABEL OR SOLS (LABEL) ×4 IMPLANT
LOOP RED MAXI  1X406MM (MISCELLANEOUS) ×6
LOOP VESSEL MAXI 1X406 RED (MISCELLANEOUS) ×18 IMPLANT
LOOP VESSEL MINI 0.8X406 BLUE (MISCELLANEOUS) ×9 IMPLANT
LOOPS BLUE MINI 0.8X406MM (MISCELLANEOUS) ×3
MANIFOLD NEPTUNE II (INSTRUMENTS) ×4 IMPLANT
NEEDLE FILTER BLUNT 18X 1/2SAF (NEEDLE) ×1
NEEDLE FILTER BLUNT 18X1 1/2 (NEEDLE) ×3 IMPLANT
NS IRRIG 500ML POUR BTL (IV SOLUTION) ×12 IMPLANT
PACK BASIN MAJOR ARMC (MISCELLANEOUS) ×4 IMPLANT
PACK UNIVERSAL (MISCELLANEOUS) ×4 IMPLANT
PENCIL ELECTRO HAND CTR (MISCELLANEOUS) ×8 IMPLANT
RETAINER VISCERA MED (MISCELLANEOUS) ×4 IMPLANT
SPONGE T-LAP 18X18 ~~LOC~~+RFID (SPONGE) ×8 IMPLANT
SPONGE T-LAP 18X36 ~~LOC~~+RFID STR (SPONGE) ×8 IMPLANT
STAPLER SKIN PROX 35W (STAPLE) ×8 IMPLANT
SUT MNCRL 4-0 (SUTURE) ×2
SUT MNCRL 4-0 27XMFL (SUTURE) ×6
SUT PDS AB 1 TP1 96 (SUTURE) ×8 IMPLANT
SUT PROLENE 2 0 SH DA (SUTURE) ×8 IMPLANT
SUT PROLENE 3 0 SH DA (SUTURE) ×28 IMPLANT
SUT PROLENE 5 0 RB 1 DA (SUTURE) ×32 IMPLANT
SUT PROLENE 5-0 (SUTURE) ×1
SUT PROLENE 5-0 BB 24X2 ARM (SUTURE) ×3
SUT PROLENE 6 0 BV (SUTURE) ×24 IMPLANT
SUT PROLENE 7 0 BV 1 (SUTURE) ×8 IMPLANT
SUT SILK 2 0 (SUTURE) ×3
SUT SILK 2 0 SH (SUTURE) ×4 IMPLANT
SUT SILK 2-0 18XBRD TIE 12 (SUTURE) ×6 IMPLANT
SUT SILK 2-0 30XBRD TIE 12 (SUTURE) ×3 IMPLANT
SUT SILK 3 0 (SUTURE) ×1
SUT SILK 3-0 18XBRD TIE 12 (SUTURE) ×3 IMPLANT
SUT SILK 4 0 (SUTURE) ×1
SUT SILK 4-0 18XBRD TIE 12 (SUTURE) ×3 IMPLANT
SUT VIC AB 2-0 CT1 27 (SUTURE) ×6
SUT VIC AB 2-0 CT1 TAPERPNT 27 (SUTURE) ×18 IMPLANT
SUT VICRYL+ 3-0 36IN CT-1 (SUTURE) ×16 IMPLANT
SUTURE MNCRL 4-0 27XMF (SUTURE) ×6 IMPLANT
SUTURE PROLEN 5-0 BB 24X2 ARM (SUTURE) ×3 IMPLANT
SYR 20ML LL LF (SYRINGE) ×4 IMPLANT
SYR 3ML LL SCALE MARK (SYRINGE) ×4 IMPLANT
SYR BULB IRRIG 60ML STRL (SYRINGE) ×4 IMPLANT
TAPE UMBILICAL 1/8 X36 TWILL (MISCELLANEOUS) ×4 IMPLANT
TOWEL OR 17X26 4PK STRL BLUE (TOWEL DISPOSABLE) ×4 IMPLANT
TRAY FOLEY MTR SLVR 16FR STAT (SET/KITS/TRAYS/PACK) ×4 IMPLANT
WATER STERILE IRR 500ML POUR (IV SOLUTION) ×8 IMPLANT

## 2021-07-10 NOTE — Op Note (Signed)
OPERATIVE NOTE   PROCEDURE: Juxtarenal aorto to right external iliac artery and the left common femoral artery bypass grafting with 14 x 7 bifurcated dacryon graft 2.   Right common iliac and external iliac artery endarterectomy 3.   Left common femoral superficial femoral and profunda femoris endarterectomy 4.   Pararenal aortic endarterectomy   PRE-OPERATIVE DIAGNOSIS: 1.Atherosclerotic occlusive disease bilateral lower extremities with rest pain 2. Aortic atherosclerosis  POST-OPERATIVE DIAGNOSIS: Same  SURGEON: Joao Mccurdy, Latina Craver CO-surgeon:  Festus Barren, M.D.   ANESTHESIA:  general  ESTIMATED BLOOD LOSS: 1400 cc  FINDING(S): 1.  Severe atherosclerotic changes to the aorta extending all the way up to the mid visceral segment as well as profound bilateral common and external iliac artery atherosclerotic changes in association with left common femoral profunda femoris and superficial femoral arteries atherosclerotic changes. The left dissection and subsequently endarterectomy was carried out to the tertiary branches of the deep femoral.  SPECIMEN(S):  Plaque from the visceral aortic segment as well as the right iliac arteries including a previously placed stent which was removed and the left femoral artery systems  INDICATIONS:     Albert [name] is a 60 y.o. y.o. male who presents with atherosclerotic occlusive disease and rest pain.  The risks and benefits as well as alternative therapies including intervention were reviewed in detail all questions were answered the patient agrees to proceed with surgery.  DESCRIPTION: After obtaining full informed written consent, the patient was brought back to the operating room and placed supine upon the operating table.  The patient received IV antibiotics prior to induction.  After obtaining adequate anesthesia, the patient was prepped and draped in the standard fashion appropriate time out is called.  Initially a central line is placed and  this will be dictated as a separate note.  Co-surgeons are required because this is a complex bilateral procedure with work being performed simultaneously from both the patient's right and left sides.  This also expedites the procedure making a shorter operative time reducing complications and improving patient safety.  Working with myself on the left side with the patient and Dr. Wyn Quaker on the patient's right side the midline incision is then created and the dissection carried down to expose the fascia. The peritoneal cavity is entered without difficulty just below the xiphoid process. As the incision is extended to the pubis. The omentum was then reflected superiorly and the small intestine is swept into the right gutter. Small intestine was then packed away with a large laparotomy pad which is moistened with saline.  The Omni-Tract retractor was then used to help facilitate our exposure.  The retroperitoneum is then opened along the midline overlying the pulse which is palpable at the level proximal to the duodenum. The inferior mesenteric vein is identified and skeletonized so that he is mobilized and able to be retracted superiorly and left laterally out of the field. The renal vein is then identified. At this point the Omni-Tract is positioned to allow full exposure of the aorta.  The aorta is then dissected proximally skeletonizing the renal vein allowing to be retracted both inferiorly and superiorly.  By retracting the vein inferiorly the left renal artery was identified and then dissected circumferentially for a distance of approximately 2 cm it was then looped with a Silastic vessel loop.  Dissection was then continued and approximately 15 mm proximal to the left renal artery the right renal artery was identified.  At this level we also identified the superior mesenteric artery.  Mesenteric artery was then reflected proximally and anteriorly the right renal artery was dissected for approximately 10 mm  and looped with a Silastic vessel loop.  We then tested a aortic clamp and was able to clamp above the right renal artery as well as the left renal artery but below the superior mesenteric artery.  The retroperitoneal dissection is then carried inferiorly past the palpable aortic bifurcation. The dissection is then carried down to the right common iliac artery bifurcation the external iliac artery was dissected for a number another several centimeters and was found to be soft.  The anterior surface of the left common and external iliac artery was then dissected.  Ureter was identified both on the right and left and left undisturbed.  A vertical incision was made in left groin and the dissection is carried down to expose the femoral sheath. Femoral sheath is entered and the common femoral arteries identified. Dissection is then carried proximally to a level 1-2 cm above the ileo-inguinal ligament. The proximal common femoral artery is then looped with a Silastic vessel loop. The superficial femoral artery is then exposed for a distance of approximately 2-3 cm and looped with a Silastic vessel loop. The profunda femoris artery is then exposed working down to the third order branches using multiple blue Silastic Vesseloops each individual branches then looped with Silastic vessel loop. Blunt dissection is then used in the anterior wall of the artery on the left to fashion a tunnel for the bypass graft. Attention is then turned to the abdomen  4000 units of heparin was given and allowed circulate for 5 minutes. An 14 x 7 Dacron bifurcated graft is then selected and rehydrated on the back table. With myself working on the left side and Dr. Wyn Quaker working on the right side the graft will be implanted.  The aorta was clamped above the level of the right renal and an aortotomy is made with 11 blade and extended with Potts scissors the aortic contents are then removed under direct visualization up to the level of the  clamp above the right renal artery this included circumferential plaque and thrombus removal from the origin of the left renal artery. The aorta was forward flushed.  The aorta was then transected in a slightly oblique fashion to allow for incorporation of the left renal artery.  The Dacron graft was then beveled to match the aorta and applied in an end graft to end aortic anastomosis using running 3-0 Prolene. Flushing maneuvers were performed and flow was established back to the renal arteries and the graft is clamped just below the suture line.   Working on the right side first the the right external iliac artery and the right common iliac artery are controlled with Vesseloops and hypogastric clamps.  Arteriotomy is made and endarterectomy is performed of the right external iliac as well as the right common iliac.  A stent is encountered at this level which is removed completely.  The iliacs are then flushed in both forward and retrograde direction.  The external iliac on the right is then irrigated with heparinized saline.  Right limb of the graft is pulled to the arteriotomy in the right external iliac.  The right limb of the graft is then beveled and applied in an endograft to side iliac artery anastomosis using running 5-0 Prolene.  Flushing maneuvers were performed and flow was reestablished to the right leg.  Excellent pulses noted in the common femoral on the right.  Attention is then  turned to the left common femoral artery.  The left limb of the bypass graft is then pulled through the previously dissected tunnel and an arteriotomy is made in the left common femoral artery with 11 blade and extended with Potts scissors onto the superficial femoral artery. Endarterectomy is then performed with a Therapist, nutritional under direct visualization extending from the common femoral down into the superficial femoral artery for distance of approximately 2 to 3 cm.  The profunda femoris is treated with the eversion  technique to the level of the third order branches as noted above in the description of the dissection. The left limb of the graft is then approximated to the arteriotomy trimmed to an appropriate bevel and an end graft to side common and superficial femoral artery anastomosis is fashioned. 5-0 Prolene is used to sew the anastomosis. Flushing maneuvers were performed and flow was established first to the common femoral in a retrograde fashion and then the profunda femoris and superficial femoral. Easily palpable pulses are noted in the profunda femoris distal to the anastomosis. The graft itself has an excellent pulse.  The retroperitoneal tissues are then irrigated with sterile saline and inspected for hemostasis Evicel with Surgicel is placed and the retroperitoneal tissues are reapproximated using running 0 Vicryl suture. The viscera was returned to its anatomic location and subsequently the fascia is closed with looped #1 PDS.  Attention is then turned to left groin incision which was irrigated Vistaseal and Surgicel are placed and the groins are reapproximated using a total of 4 layers of Vicryl with 2 layers of 2-0 Vicryl in a running fashion followed by 2 layers of 3-0 Vicryl in a running fashion followed by 4-0 Monocryl subcuticular. The abdominal incision is closed with staples.  COMPLICATIONS: None  CONDITION: Almon Register 04/11/2015 4:18 PM

## 2021-07-10 NOTE — Anesthesia Procedure Notes (Signed)
Procedure Name: Intubation Date/Time: 07/10/2021 8:51 AM Performed by: Mohammed Kindle, CRNA Pre-anesthesia Checklist: Patient identified, Emergency Drugs available, Suction available and Patient being monitored Patient Re-evaluated:Patient Re-evaluated prior to induction Oxygen Delivery Method: Circle system utilized Preoxygenation: Pre-oxygenation with 100% oxygen Induction Type: IV induction Ventilation: Mask ventilation without difficulty Laryngoscope Size: McGraph and 3 Grade View: Grade I Tube type: Oral Tube size: 6.5 mm Number of attempts: 1 Airway Equipment and Method: Stylet and Oral airway Placement Confirmation: ETT inserted through vocal cords under direct vision, positive ETCO2, breath sounds checked- equal and bilateral and CO2 detector Secured at: 21 cm Tube secured with: Tape Dental Injury: Teeth and Oropharynx as per pre-operative assessment

## 2021-07-10 NOTE — Op Note (Signed)
OPERATIVE NOTE   PROCEDURE:    1.  Juxtarenal aorta to right external iliac and left femoral bypass with 14 mm diameter proximal 7 mm diameter distal bifurcated Dacron graft 2.   Left common femoral, profunda femoris, and superficial femoral artery endarterectomies 3.   Right external iliac and distal common iliac endarterectomies and removal of previous stent in same location  4.   Aortic endarterectomy up to the level of the left renal artery   PRE-OPERATIVE DIAGNOSIS: 1.Atherosclerotic occlusive disease bilateral lower extremities with rest pain 2. Aortic atherosclerosis 3.  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  Aortoiliac disease, significant plaque in left common femoral, profunda femoris, and superficial femoral arteries, occluded stent at the right iliac bifurcation with hyperplasia extending in the proximal portion of the external iliac artery, severe aortic plaque and thrombus extending up to the level of the left renal artery  SPECIMEN(S):  Left common femoral, profunda femoris, and superficial femoral artery plaque. Right external iliac artery plaque and occluded stent in the common iliac and proximal external iliac artery Aortic plaque and thrombus  INDICATIONS:    Patient presents with rest pain in both legs.  Aortobifemoral/iliac bypass is planned for revascularization for limb salvage.  His aortic disease went up to and actually above the level of the left renal artery which precluded safe endovascular therapy.  The risks and benefits as well as alternative therapies including intervention were reviewed in detail all questions were answered the patient agrees to proceed with surgery. Co-surgeons are used to expedite the procedure and reduce operative time as bilateral  work needs to be done.  DESCRIPTION: After obtaining full informed written consent, the patient was brought back to the operating room and placed supine upon the operating table.  The patient received IV antibiotics prior to induction.  After obtaining adequate anesthesia, the patient was prepped and draped in the standard fashion appropriate time out is called.  Initially a central line is placed and this will be dictated as a separate note.   The midline incision is then created and the dissection carried down to expose the fascia. The peritoneal cavity is entered without difficulty just below the xiphoid process. As the incision is extended to the supraumbilical area. Scant adhesions were encountered and were taken down. The omentum was then reflected superiorly and the small intestine is swept into the right gutter. Small intestine was then packed away with a large laparotomy pad which is moistened with saline.  The Omni-Tract retractor was then used to help facilitate our exposure. At this point the Omni-Tract is positioned to allow full exposure of the aorta.  The retroperitoneum is then opened along the midline overlying the pulse which is palpable at the level of the duodenum. The inferior mesenteric vein is identified and skeletonized so that he is mobilized and able to be retracted superiorly and left laterally out of the field. The renal vein is then identified.  The renal vein had to be mobilized as our dissection had to carry up to the aorta at the base of the SMA.  The lumbar renal vein and the left gonadal vein were ligated divided tween silk ties to help facilitate the exposure.  The renal arteries were then dissected out well beyond the origins bilaterally.  This was tedious, particularly on the  right which was higher than the left.  Both were encircled with vessel loops in the orders prepared for control at the base of the SMA.    The distal aorta was dissected out down into both iliac  arteries.  On the left side, the iliac artery was occluded all the way down in the common femoral artery was disease, so the bypass will have to go to the left femoral artery.  On the right side, the common iliac artery and the previously placed stent that extended down to the proximal portion of the external iliac artery and some hyperplasia beyond the stent in the proximal to mid external iliac artery were occluded, but beyond this the vessel was of reasonable quality and the femoral artery was not heavily diseased allowing for a bypass to the right external iliac artery at that level.  This was dissected out and controlled proximally distally with Vesseloops in the right external iliac artery and common iliac artery.  The retroperitoneal dissection is then carried inferiorly past the palpable aortic bifurcation. Moving back superiorly the aorta is dissected circumferentially above the level of the renal vein and the suprarenal aorta it is soft at this level and acceptable for clamp. It is then dissected along its right side taking care not to injure the vena cava. The dissection is then carried down to the aortic bifurcation and right common iliac artery . Attention is then turned to the left common iliac which is dissected out and prepared for control.    With myself working on the right and Dr. Gilda Crease working on the left we began by dissecting out the femoral arteries on the left side. Vertical incisions were created overlying the left femoral arteries. The common femoral artery proximally, and superficial femoral artery, and primary profunda femoris artery branches were encircled with vessel loops and prepared for control. The left femoral arteries were found to have significant plaque from the common femoral artery into the profunda and superficial femoral arteries well beyond its origin.  At this point we proceeded with the tunneling maneuvers taking care to stay below the ureters which were  identified and protected from harm. Blunt dissection was used from the femoral incision up to the retroperitoneum and an umbilical tape was brought through the tunnel on the left side and would later be exchanged for the graft.   5000 units of heparin was given and allowed circulate for 5 minutes. An 14 mm x 7 mm  Dacron bifurcated graft is then selected and rehydrated on the back table. With myself working on the right side and Dr. Gilda Crease working on the left side the graft will be implanted.  The aorta was clamped just above the level of the right renal and an aortotomy is made with 11 blade and extended with Potts scissors the aortic contents are then removed under direct visualization. There was some significant thick aortic plaque that was removed.  This extended up into the perirenal aorta above the level of the left renal artery origin which was lower.  All loose contents were removed.  The artery was dissected out and transected at the base of the left renal artery.  There were 2 large lumbar arteries which were extremely difficult to control as they were at and above the level of the left renal artery.  Ultimately we were able to control these with a combination of silk suture ligatures, 5-0 Prolene sutures, and then the suture line of the graft. The aorta was forward flushed.  The Dacron graft was then beveled and applied in an end graft to end aortic anastomosis using running 3-0 Prolene.  This was done at the base of the left renal artery and not far below the right renal artery.  Flushing maneuvers were performed and flow was established back to the distal aorta the graft is clamped just above the suture line.   We then turned our attention to the right iliac artery.  Control was obtained in the mid to distal right external iliac artery and at the iliac bifurcation.  An arteriotomy was made with 11 blade and extended with Potts scissors up to the most proximal right external iliac artery.  At this  location, was an occluded stent that was in the distal common iliac artery and proximal right external iliac artery and thick intimal hyperplasia.  The freer elevator was used to gain a good plane in the plaque was removed gently.  The stent was then grasped and pulled out from the distal common iliac artery and proximal external iliac artery.  A nice feathered distal endpoint was created in the mid right external iliac artery.  After the endarterectomy, the right limb of the graft was then pulled to tension and cut and beveled to an appropriate length to match the arteriotomy.  The anastomosis of the right limb of the bypass graft to the right external iliac artery was then performed with a running 5-0 Prolene suture in an end-to-side fashion.  It was flushed and de-aired prior to release of control.  Several 6-0 Prolene patch sutures were used for hemostasis and following this there was an excellent palpable pulse in the femoral artery as well as backbleeding up into the aorta.  At this point, we clamped the right common iliac artery and oversewed the distal aortic stump.  This would allow perfusion to the inferior mesenteric artery as well as pelvic perfusion through the hypogastric artery although this was heavily diseased.  Then, using the umbilical tape as a guide, the left limb of the graft was tunneled in the retroperitoneal  to the left femoral incision. Care was taken to keep orientation with the line on the Dacron graft upward.  Attention is then turned to the left femoral artery.  An arteriotomy is made with 11 blade and extended with Potts scissors in the common femoral artery and carried down onto the first 3 cm of the superficial femoral artery. An endarterectomy was then performed. The [facility] was used to create a plane. The proximal endpoint was created with gentle traction. This was in the proximal to mid mid common femoral artery. An eversion endarterectomy was then performed for the  first 2 cm of the profunda femoris artery with a nice feathered endpoint seen with gentle traction. Good backbleeding was then seen. The distal endpoint of the superficial femoral artery endarterectomy was created with gentle traction and the distal endpoint was nice and feathered and this was several centimeters down the superficial femoral artery beyond the arteriotomy creating a fairly extensive endarterectomy in the left superficial femoral artery.  The left limb of the graft is then approximated to the arteriotomy, trimmed to an appropriate length and beveled and an end graft to side femoral artery.  The anastomosis is fashioned including the graft over the origin of the superficial femoral artery over its first 3 cm. 6-0 Prolene is used to sew the anastomosis. Flushing maneuvers were performed and flow was established first to the profunda femoris artery and then to the  superficial femoral artery. Easily palpable pulses are noted well beyond the anastomosis and both arteries.  The retroperitoneal tissues are then irrigated with sterile saline and inspected for hemostasis Vistacel with Surgicel and Fibrillar is placed and the retroperitoneal tissues are reapproximated using running 0 Vicryl suture. The viscera was returned to its anatomic location and subsequently the fascia is closed with looped #1 PDS skin is closed with staples.  Fibrillar and Vistacel topical hemostatic agents were placed in the femoral incisions and hemostasis was complete. The femoral incisions were then closed in a layered fashion with 2 layers of 2-0 Vicryl, 2 layers of 3-0 Vicryl, and staples for the skin closure. Sterile dressing were then placed over all incisions.  The patient was then awakened from anesthesia and taken to the recovery room in stable condition having tolerated the procedure well.  COMPLICATIONS: None  CONDITION: Stable   This note was created with Dragon Medical transcription system. Any errors in  dictation are purely unintentional.

## 2021-07-10 NOTE — Progress Notes (Signed)
Patient arrived to pacu, awake/alert c/o's discomfort left groin and midline. X2 dressing with staples and honeycomb, noted old drainage on dressings.  Left radial a-line intact, PIV x2. RIJ TLC in place x3 ports capped. X-ray completed in pacu. Labs drawn as ordered and sent.  Afebrile, vitals baseline for patient. Pulses intact x4. Indwelling foley catheter in place, monitor hourly output. Transported to ICU in bed on cardiac monitor with RN

## 2021-07-10 NOTE — OR Nursing (Signed)
Dr.Dew notified that patient had his last dose of eliquis on Sunday of this week.

## 2021-07-10 NOTE — Transfer of Care (Signed)
Immediate Anesthesia Transfer of Care Note  Patient: Lino Wickliff  Procedure(s) Performed: Aortic Endarterectomy; Right Iliac Endarterectomy; Left Common Femoral, Profundo-femoral and Superficial Endarterectomy; Aorta to Right External Iliac Bypass and Left Fermoral Bypass (Bilateral: Abdomen) APPLICATION OF CELL SAVER INSERTION CENTRAL LINE ADULT (Right: Neck)  Patient Location: PACU  Anesthesia Type:General  Level of Consciousness: awake, drowsy and patient cooperative  Airway & Oxygen Therapy: Patient Spontanous Breathing and Patient connected to face mask oxygen  Post-op Assessment: Report given to RN and Post -op Vital signs reviewed and stable  Post vital signs: Reviewed and stable  Last Vitals:  Vitals Value Taken Time  BP 95/82 07/10/21 1415  Temp    Pulse 90 07/10/21 1419  Resp 9 07/10/21 1419  SpO2 100 % 07/10/21 1419  Vitals shown include unvalidated device data.  Last Pain:  Vitals:   07/10/21 0738  TempSrc: Oral  PainSc: 0-No pain         Complications: No notable events documented.

## 2021-07-10 NOTE — Progress Notes (Signed)
Patient approved to have ice chips by Dr. Wyn Quaker. No confirmation report from [facility] about NGT placement. Tube meds held. NGT placed to LIS per physician verbal order at bedside. Return of scant bloody/brown gastric contents. Will continue to monitor.

## 2021-07-10 NOTE — H&P (Signed)
[facility] VEIN SPECIALISTS Admission History & Physical  MRN : [id number]  Albert [name] is a 60 y.o. (02-25-1961) male who presents with chief complaint of No chief complaint on file. 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  He has had an extensive preoperative evaluation and was felt to be adequate risk for proceeding with aortobifemoral bypass.  He continues to have rest pain in both feet.  He is extremely limited in his walking.  Current Facility-Administered Medications  Medication Dose Route Frequency Provider Last Rate Last Admin   ceFAZolin (ANCEF) 2-4 GM/100ML-% IVPB            ceFAZolin (ANCEF) IVPB 2g/100 mL premix  2 g Intravenous On Call to OR Georgiana Spinner, NP       Chlorhexidine Gluconate Cloth 2 % PADS 6 each  6 each Topical Once Georgiana Spinner, NP       And   Chlorhexidine Gluconate Cloth 2 % PADS 6 each  6 each Topical Once Georgiana Spinner, NP       HYDROmorphone (DILAUDID) injection 1 mg  1 mg Intravenous Once PRN Georgiana Spinner, NP       lactated ringers infusion   Intravenous Continuous Foye Deer, MD 10 mL/hr at 07/10/21 0755 New Bag at 07/10/21 0755   ondansetron (ZOFRAN) injection 4 mg  4 mg Intravenous Q6H PRN Georgiana Spinner, NP        Past Medical History:  Diagnosis Date   Angina at rest [facility])    Anxiety    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 b.) CT head 04/15/2021 --> remote infarction  along the upper margin of the RIGHT putamen common; unchanged.   Lumbar radiculopathy    Marijuana use (daily)    Nephrolithiasis    PVD (peripheral vascular disease) ([name])    Scoliosis    Seizures ([name])    a.) on daily levetiracetam. b.) last seizure 05/2021 (has about 6/year)   SOB (shortness of breath)    Testicular torsion    a.) age 41; required surgical reduction.    Past Surgical History:  Procedure Laterality Date   ABDOMINAL AORTOGRAM W/LOWER EXTREMITY N/A 03/08/2021   Procedure: ABDOMINAL AORTOGRAM W/LOWER EXTREMITY;  Surgeon: Annice Needy, MD;  Location: [facility] INVASIVE CV LAB;  Service: Cardiovascular;  Laterality: N/A;   TESTICLE TORSION REDUCTION     age 43     Social History         Tobacco Use   Smoking status: Former Smoker      Quit date: 03/05/2021      Years since quitting: 0.0   Smokeless tobacco: Never Used  Substance Use Topics   Alcohol use: Yes      Comment: occ   Drug use: Yes      Types: Marijuana             Family History  Problem Relation Age  of Onset   Hypertension Father    No bleeding or clotting disorders.  No aneurysms   No Known Allergies     REVIEW OF SYSTEMS (Negative unless checked)   Constitutional: [] Weight loss  [] Fever  [] Chills Cardiac: [] Chest pain   [] Chest pressure   [] Palpitations   [] Shortness of breath when laying flat   [] Shortness of breath at rest   [] Shortness of breath with exertion. Vascular:  [x] Pain in legs with walking   [] Pain in legs at rest   [] Pain in legs when laying flat   [] Claudication   [] Pain in feet when walking  [x] Pain in feet at rest  [x] Pain in feet when laying flat   [] History of DVT   [] Phlebitis   [] Swelling in legs   [] Varicose veins   [] Non-healing ulcers Pulmonary:   [] Uses home oxygen   [] Productive cough   [] Hemoptysis   [] Wheeze  [] COPD   [] Asthma Neurologic:  [] Dizziness  [] Blackouts   [x] Seizures   [] History of stroke   [] History of TIA  [] Aphasia   [] Temporary blindness   [] Dysphagia    [] Weakness or numbness in arms   [] Weakness or numbness in legs Musculoskeletal:  [] Arthritis   [] Joint swelling   [] Joint pain   [] Low back pain Hematologic:  [] Easy bruising  [] Easy bleeding   [] Hypercoagulable state   [] Anemic   Gastrointestinal:  [] Blood in stool   [] Vomiting blood  [] Gastroesophageal reflux/heartburn   [] Abdominal pain Genitourinary:  [] Chronic kidney disease   [] Difficult urination  [] Frequent urination  [] Burning with urination   [] Hematuria Skin:  [] Rashes   [] Ulcers   [] Wounds Psychological:  [] History of anxiety   []  History of major depression.    Physical Examination  Vitals:   07/10/21 0738  BP: (!) 94/59  Pulse: (!) 50  Resp: 16  Temp: 98.4 F (36.9 C)  TempSrc: Oral  SpO2: 100%  Weight: 48.5 kg  Height: 5\' 7"  (1.702 m)   Body mass index is 16.76 kg/m. Gen: WD/WN, NAD Head: [place]/AT, No temporalis wasting.  Ear/Nose/Throat: Hearing grossly intact, nares w/o erythema or drainage, oropharynx w/o Erythema/Exudate,  Eyes: Conjunctiva clear, sclera non-icteric Neck: Trachea midline.  No JVD.  Pulmonary:  Good air movement, respirations not labored, no use of accessory muscles.  Cardiac: RRR, normal S1, S2. Vascular:  Vessel Right Left  Radial Palpable Palpable                          PT Not Palpable Not Palpable  DP Not Palpable Not Palpable   Gastrointestinal: soft, non-tender/non-distended. No guarding/reflex.  Musculoskeletal: M/S 5/5 throughout.   No deformity or atrophy.  Neurologic: Sensation grossly intact in extremities.  Symmetrical.  Speech is fluent. Motor exam as listed above. Psychiatric: Judgment intact, Mood & affect appropriate for pt's clinical situation. Dermatologic: No rashes or ulcers noted.  No cellulitis or open wounds.      CBC Lab Results  Component Value Date   WBC 10.2 07/03/2021   HGB 13.1 07/03/2021   HCT 39.5 07/03/2021   MCV 96.8 07/03/2021   PLT 426 (H) 07/03/2021    BMET    Component Value  Date/Time   NA 137 07/03/2021 1317   K 4.1 07/03/2021 1317   CL 102 07/03/2021 1317   CO2 25 07/03/2021 1317   GLUCOSE 124 (H) 07/03/2021 1317   BUN 9 07/03/2021 1317   CREATININE 0.70 07/03/2021 1317   CALCIUM 9.1 07/03/2021 1317   GFRNONAA >  60 07/03/2021 1317   Estimated Creatinine Clearance: 68.2 mL/min (by C-G formula based on SCr of 0.7 mg/dL).  COAG Lab Results  Component Value Date   INR 0.9 03/06/2021    Radiology No results found.   Assessment/Plan Atherosclerosis of native arteries of extremity with rest pain [facility]) He had severe aortic disease going all the way up to and just above the left renal artery which was the lowest of the 2 renal arteries.  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  Given the extensive aortic thrombus going up and even above the left renal artery, I am hesitant to offer him endoluminal therapy particularly given his young age and reasonably good status for open surgical repair.  I think the safest option particular for his left kidney is likely open surgical therapy which would be an aortobifemoral bypass likely with an aortic endarterectomy and may be a left renal endarterectomy as well.  This is a very big serious procedure, and would have an extended hospital stay and recovery but I think it will serve him the best long-term.  I have given him several weeks of Eliquis samples today in the office that will take [place] up to the time of surgery.  After his repair, I think we can consider just going to antiplatelet therapy.  Risks and benefits the procedure were discussed in detail and he is agreeable to proceed.   Aortic mural thrombus ([name]) See above.  This is very extensive and goes up to and even above the left renal artery.   Hypertension blood pressure control important in reducing the progression  of atherosclerotic disease. On appropriate oral medications.     Tobacco dependence This is the major atherosclerotic risk factor in his case and cessation would be of great benefit.   Festus Barren, MD  07/10/2021 8:13 AM

## 2021-07-10 NOTE — Progress Notes (Signed)
Patient hypothermic on assessment, 94.3 axillary. Warm blankets applied, room temp increased. Recheck 96.2 axillary. Will continue to monitor.

## 2021-07-10 NOTE — Plan of Care (Signed)

## 2021-07-10 NOTE — Anesthesia Procedure Notes (Signed)
Arterial Line Insertion Start/End10/02/2021 8:55 AM, 07/10/2021 9:00 AM Performed by: Corinda Gubler, MD, anesthesiologist  Patient location: Pre-op. Preanesthetic checklist: patient identified, IV checked, site marked, risks and benefits discussed, surgical consent, monitors and equipment checked, pre-op evaluation, timeout performed and anesthesia consent Patient sedated Left, radial was placed Catheter size: 20 G Hand hygiene performed  and maximum sterile barriers used   Attempts: 1 Procedure performed using ultrasound guided technique. Ultrasound Notes:anatomy identified, needle tip was noted to be adjacent to the nerve/plexus identified and no ultrasound evidence of intravascular and/or intraneural injection Following insertion, dressing applied and Biopatch. Post procedure assessment: normal and unchanged  Patient tolerated the procedure well with no immediate complications.

## 2021-07-10 NOTE — Anesthesia Preprocedure Evaluation (Addendum)
Anesthesia Evaluation  Patient identified by MRN, date of birth, ID band Patient awake    Reviewed: Allergy & Precautions, NPO status , Patient's Chart, lab work & pertinent test results  History of Anesthesia Complications Negative for: history of anesthetic complications  Airway Mallampati: II  TM Distance: >3 FB Neck ROM: Full    Dental no notable dental hx. (+) Poor Dentition, Chipped,    Pulmonary neg sleep apnea, neg COPD, Current Smoker and Patient abstained from smoking.,    Pulmonary exam normal breath sounds clear to auscultation       Cardiovascular Exercise Tolerance: Good METS: 5 - 7 Mets hypertension, + angina + Peripheral Vascular Disease  (-) CAD and (-) Past MI (-) dysrhythmias  Rhythm:Regular Rate:Normal - Systolic murmurs Myocardial perfusion imaging study was performed on 05/23/2021 revealing hyperdynamic left ventricular ejection fraction of greater than 65%.  There was no evidence of stress-induced myocardial ischemia or arrhythmias.  Study determined to be normal and low risk.   Neuro/Psych Seizures -, Poorly Controlled,  PSYCHIATRIC DISORDERS Anxiety Depression Last seizure last month; takes daily meds.  Neuromuscular disease CVA, No Residual Symptoms    GI/Hepatic neg GERD  ,(+)     (-) substance abuse  ,   Endo/Other  neg diabetes  Renal/GU negative Renal ROS     Musculoskeletal   Abdominal   Peds  Hematology   Anesthesia Other Findings Past Medical History: No date: Angina at rest [facility]) No date: Anxiety 03/06/2021: Aortic mural thrombus ([name])     Comment:  a.) Significant mural thrombus within the infrarenal               abdominal aorta with subsequent occlusion of the LEFT               common iliac artery. No date: Chronic anticoagulation     Comment:  a.) ASA + apixaban No date: DDD (degenerative disc disease), lumbar No date: Depression No date: Diverticulosis No date:  Dyshidrotic eczema No date: HLD (hyperlipidemia) No date: Hypertension No date: Lacunar infarction [facility])     Comment:  a.) MRI 05/18/2020 --> Old small vessell infarction in               the RIGHT basal ganglia to corona radiography region. b.)              CT head 04/15/2021 --> remote infarction along the upper               margin of the RIGHT putamen common; unchanged. No date: Lumbar radiculopathy No date: Marijuana use (daily) No date: Nephrolithiasis No date: PVD (peripheral vascular disease) ([name]) No date: Scoliosis No date: Seizures ([name])     Comment:  a.) on daily levetiracetam. b.) last seizure 05/2021 (has              about 6/year) No date: SOB (shortness of breath) No date: Testicular torsion     Comment:  a.) age 67; required surgical reduction.  Reproductive/Obstetrics                            Anesthesia Physical Anesthesia Plan  ASA: 3  Anesthesia Plan: General   Post-op Pain Management:    Induction: Intravenous  PONV Risk Score and Plan: 2 and Ondansetron, Dexamethasone and Midazolam  Airway Management Planned: Oral ETT  Additional Equipment: Arterial line  Intra-op Plan:   Post-operative Plan: Extubation in OR and Possible Post-op intubation/ventilation  Informed Consent: I have reviewed the patients History and Physical, chart, labs and discussed the procedure including the risks, benefits and alternatives for the proposed anesthesia with the patient or authorized representative who has indicated his/her understanding and acceptance.     Dental advisory given  Plan Discussed with: CRNA and Surgeon  Anesthesia Plan Comments: (Discussed risks of anesthesia with patient, including PONV, sore throat, lip/dental damage. Rare risks discussed as well, such as cardiorespiratory and neurological sequelae, and allergic reactions. Patient counseled on being higher risk for anesthesia due to comorbidities: severe PVD and high risk  nature of surgery. Patient was told about increased risk of cardiac and respiratory events, including death. Discussed possible blood transfusion risk and post op prolonged intubation. Patient counseled on benefits of smoking cessation, and increased perioperative risks associated with continued smoking. Will plan for post induction arterial line and attempt second large bore IV. Patient understands.)        Anesthesia Quick Evaluation

## 2021-07-11 ENCOUNTER — Encounter: Payer: Self-pay | Admitting: Vascular Surgery

## 2021-07-11 DIAGNOSIS — I70229 Atherosclerosis of native arteries of extremities with rest pain, unspecified extremity: Secondary | ICD-10-CM

## 2021-07-11 LAB — BASIC METABOLIC PANEL
Anion gap: 5 (ref 5–15)
BUN: 19 mg/dL (ref 6–20)
CO2: 19 mmol/L — ABNORMAL LOW (ref 22–32)
Calcium: 7.1 mg/dL — ABNORMAL LOW (ref 8.9–10.3)
Chloride: 113 mmol/L — ABNORMAL HIGH (ref 98–111)
Creatinine, Ser: 1.13 mg/dL (ref 0.61–1.24)
GFR, Estimated: >60 mL/min (ref >60)
Glucose, Bld: 206 mg/dL — ABNORMAL HIGH (ref 70–99)
Potassium: 4.1 mmol/L (ref 3.5–5.1)
Sodium: 137 mmol/L (ref 135–145)

## 2021-07-11 LAB — PREPARE RBC (CROSSMATCH)

## 2021-07-11 LAB — BPAM RBC
Blood Product Expiration Date: 202211052359
Blood Product Expiration Date: 202211052359
Blood Product Expiration Date: 202211052359
Blood Product Expiration Date: 202211052359
ISSUE DATE / TIME: 202210051149
ISSUE DATE / TIME: 202210051149
Unit Type and Rh: 6200
Unit Type and Rh: 6200
Unit Type and Rh: 6200
Unit Type and Rh: 6200

## 2021-07-11 LAB — TYPE AND SCREEN
ABO/RH(D): A POS
Antibody Screen: NEGATIVE
Unit division: 0
Unit division: 0
Unit division: 0
Unit division: 0

## 2021-07-11 LAB — CBC
HCT: 39 % (ref 39.0–52.0)
Hemoglobin: 13.8 g/dL (ref 13.0–17.0)
MCH: 32.3 pg (ref 26.0–34.0)
MCHC: 35.4 g/dL (ref 30.0–36.0)
MCV: 91.3 fL (ref 80.0–100.0)
Platelets: 214 10*3/uL (ref 150–400)
RBC: 4.27 MIL/uL (ref 4.22–5.81)
RDW: 14.8 % (ref 11.5–15.5)
WBC: 21.6 10*3/uL — ABNORMAL HIGH (ref 4.0–10.5)
nRBC: 0 % (ref 0.0–0.2)

## 2021-07-11 LAB — MAGNESIUM: Magnesium: 1.4 mg/dL — ABNORMAL LOW (ref 1.7–2.4)

## 2021-07-11 MED ORDER — POTASSIUM CHLORIDE 20 MEQ PO PACK
20.0000 meq | PACK | Freq: Every day | ORAL | Status: DC | PRN
  Administered 2021-07-12: 40 meq
  Filled 2021-07-11: qty 2

## 2021-07-11 MED ORDER — DOCUSATE SODIUM 50 MG/5ML PO LIQD
100.0000 mg | Freq: Every day | ORAL | Status: DC
  Administered 2021-07-11 – 2021-07-15 (×5): 100 mg
  Filled 2021-07-11 (×5): qty 10

## 2021-07-11 MED ORDER — MAGNESIUM SULFATE 2 GM/50ML IV SOLN
2.0000 g | Freq: Once | INTRAVENOUS | Status: AC
  Administered 2021-07-11: 2 g via INTRAVENOUS
  Filled 2021-07-11: qty 50

## 2021-07-11 MED ORDER — GABAPENTIN 250 MG/5ML PO SOLN
100.0000 mg | Freq: Three times a day (TID) | ORAL | Status: DC
  Administered 2021-07-11 – 2021-07-14 (×10): 100 mg
  Filled 2021-07-11 (×13): qty 2

## 2021-07-11 MED ORDER — GABAPENTIN 250 MG/5ML PO SOLN
100.0000 mg | Freq: Three times a day (TID) | ORAL | Status: DC
  Filled 2021-07-11: qty 2

## 2021-07-11 MED ORDER — ASPIRIN 81 MG PO CHEW
81.0000 mg | CHEWABLE_TABLET | Freq: Every day | ORAL | Status: DC
  Administered 2021-07-11 – 2021-07-15 (×5): 81 mg
  Filled 2021-07-11 (×5): qty 1

## 2021-07-11 NOTE — Evaluation (Signed)
Physical Therapy Evaluation Patient Details Name: Albert [name] MRN: [id number] DOB: 26-Jul-1961 Today's Date: 07/11/2021  History of Present Illness  Per vascular. 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  He has had an extensive preoperative evaluation and was felt to be adequate risk for proceeding with aortobifemoral bypass.  He continues to have rest pain in both feet.  He is extremely limited in his walking." 10/5 1. underwent Justarenal aorta to right external iliac and left femoral bypass 2. Left common femoral, profunda femoris, and superficial femoral artery endarterectomies 3. Right external iliac and distal common iliac endarterectomies and removal of previous stent in same location 4. Aortic endarterectomy up to the level of the left renal artery   Clinical Impression  Pt is a pleasnat 60 year old male who presents POD #1 vascular surgery (listed above) to L LE. Pt reports being independent prior to admission with some assistance from friends for IADLs. Pt endorses multiple falls in the past due to seizures and denies any prior usage of assistive devices. Currently, pt requiring minA for bed mobility, transfers, and ambulation of 2 ft with RW. Pt demonstrating impaired L LE strength, ROM, pain, and decreased activity tolerance. Pt reports that he plans to discharge home with his brother who can provide intermittent assistance. Unsure of home environment of brother's home for accessibility. Recommending home with home health PT + 24/7 supervision/assistance due to history of falls with seizure activity. Pt will benefit from skilled PT services during admission to improve mobility and promote independence.      Recommendations for follow up therapy are one component of a multi-disciplinary discharge planning process, led by the attending physician.  Recommendations may be updated based on patient status, additional functional criteria and  insurance authorization.  Follow Up Recommendations Home health PT;Supervision/Assistance - 24 hour;Supervision for mobility/OOB    Equipment Recommendations  Rolling walker with 5" wheels    Recommendations for Other Services       Precautions / Restrictions Precautions Precautions: Fall Restrictions Weight Bearing Restrictions: Yes LLE Weight Bearing: Weight bearing as tolerated      Mobility  Bed Mobility Overal bed mobility: Needs Assistance Bed Mobility: Supine to Sit     Supine to sit: Min assist     General bed mobility comments: MinA with HHA and cues for management of B LEs    Transfers Overall transfer level: Needs assistance Equipment used: Rolling walker (2 wheeled) Transfers: Sit to/from Stand Sit to Stand: Min assist         General transfer comment: MinA to stand at EOB and cues for hand placement on RW  Ambulation/Gait Ambulation/Gait assistance: Min assist Gait Distance (Feet): 2 Feet Assistive device: Rolling walker (2 wheeled) Gait Pattern/deviations: Step-to pattern;Decreased weight shift to left     General Gait Details: Steps toward recliner chair with assistance managing RW during turn towards chair.  Stairs            Wheelchair Mobility    Modified Rankin (Stroke Patients Only)       Balance Overall balance assessment: Needs assistance Sitting-balance support: No upper extremity supported;Feet supported Sitting balance-Leahy Scale: Good     Standing balance support: During functional activity;Bilateral upper extremity supported Standing balance-Leahy Scale: Fair Standing balance comment: No evidence of LOB with B UE assist during transfer  Pertinent Vitals/Pain Pain Assessment: Faces Faces Pain Scale: Hurts whole lot Pain Location: Abdomen and L groin near incisions Pain Descriptors / Indicators: Grimacing;Guarding Pain Intervention(s): Limited activity within patient's  tolerance;Monitored during session;Repositioned;Utilized relaxation techniques    Home Living Family/patient expects to be discharged to:: Private residence Living Arrangements: Alone Available Help at Discharge: Family;Available PRN/intermittently Type of Home: Apartment Home Access: Stairs to enter   Entrance Stairs-Number of Steps: 1 Home Layout: Two level;Laundry or work area in basement;Bed/bath upstairs   Additional Comments: Pt reports living upstairs in a lofted apartment wtih 15 steps up to his bedroom. Pt reports that after discharge he may plan to go live with brother    Prior Function Level of Independence: Independent         Comments: Pt reports being independent at baseline     Hand Dominance        Extremity/Trunk Assessment   Upper Extremity Assessment Upper Extremity Assessment: Overall WFL for tasks assessed    Lower Extremity Assessment Lower Extremity Assessment: LLE deficits/detail LLE Deficits / Details: No formal MMT performed due to increased pain, grossly 3/5 for all LE joints. LLE: Unable to fully assess due to pain LLE Sensation: WNL LLE Coordination: WNL       Communication   Communication: No difficulties  Cognition Arousal/Alertness: Awake/alert Behavior During Therapy: WFL for tasks assessed/performed Overall Cognitive Status: Within Functional Limits for tasks assessed                                        General Comments      Exercises     Assessment/Plan    PT Assessment Patient needs continued PT services  PT Problem List Decreased strength;Decreased range of motion;Decreased activity tolerance;Decreased balance;Decreased mobility;Decreased coordination;Decreased knowledge of use of DME;Decreased knowledge of precautions       PT Treatment Interventions DME instruction;Gait training;Stair training;Functional mobility training;Therapeutic activities;Therapeutic exercise;Balance training;Neuromuscular  re-education;Patient/family education    PT Goals (Current goals can be found in the Care Plan section)  Acute Rehab PT Goals Patient Stated Goal: to feel better PT Goal Formulation: With patient Time For Goal Achievement: 07/25/21 Potential to Achieve Goals: Fair    Frequency Min 2X/week   Barriers to discharge        Co-evaluation               AM-PAC PT "6 Clicks" Mobility  Outcome Measure Help needed turning from your back to your side while in a flat bed without using bedrails?: A Little Help needed moving from lying on your back to sitting on the side of a flat bed without using bedrails?: A Little Help needed moving to and from a bed to a chair (including a wheelchair)?: A Little Help needed standing up from a chair using your arms (e.g., wheelchair or bedside chair)?: A Little Help needed to walk in hospital room?: A Little Help needed climbing 3-5 steps with a railing? : Total 6 Click Score: 16    End of Session Equipment Utilized During Treatment: Gait belt Activity Tolerance: Patient tolerated treatment well Patient left: in chair;with call bell/phone within reach;with chair alarm set;with SCD's reapplied Nurse Communication: Mobility status PT Visit Diagnosis: Unsteadiness on feet (R26.81);Muscle weakness (generalized) (M62.81);History of falling (Z91.81)    Time: 7829-5621 PT Time Calculation (min) (ACUTE ONLY): 24 min   Charges:   PT Evaluation $PT Eval Low Complexity: 1  Low          Verl Blalock, SPT   Verl Blalock 07/11/2021, 2:45 PM

## 2021-07-11 NOTE — Anesthesia Postprocedure Evaluation (Signed)
Anesthesia Post Note  Patient: Ancel Easler  Procedure(s) Performed: Aortic Endarterectomy; Right Iliac Endarterectomy; Left Common Femoral, Profundo-femoral and Superficial Endarterectomy; Aorta to Right External Iliac Bypass and Left Fermoral Bypass (Bilateral: Abdomen) APPLICATION OF CELL SAVER INSERTION CENTRAL LINE ADULT (Right: Neck)  Patient location during evaluation: Nursing Unit Anesthesia Type: General Level of consciousness: oriented and awake and alert Pain management: pain level controlled Vital Signs Assessment: post-procedure vital signs reviewed and stable Respiratory status: spontaneous breathing and respiratory function stable Cardiovascular status: blood pressure returned to baseline and stable Postop Assessment: no headache, no backache, no apparent nausea or vomiting and patient able to bend at knees Anesthetic complications: no   No notable events documented.   Last Vitals:  Vitals:   07/11/21 0800 07/11/21 0900  BP: (!) 146/77 (!) 134/93  Pulse: 61 67  Resp: 15 16  Temp:    SpO2: 100% 100%    Last Pain:  Vitals:   07/11/21 0700  TempSrc:   PainSc: 5                  Starling Manns

## 2021-07-11 NOTE — Progress Notes (Signed)
[facility] Daily Progress Note  07/10/21:    1.  Juxtarenal aorta to right external iliac and left femoral bypass with 14 mm diameter proximal 7 mm diameter distal bifurcated Dacron graft 2.   Left common femoral, profunda femoris, and superficial femoral artery endarterectomies 3.   Right external iliac and distal common iliac endarterectomies and removal of previous stent in same location  4.   Aortic endarterectomy up to the level of the left renal arter  Subjective: Patient with incisional discomfort this AM.  No acute issues overnight.  Patient sitting in chair comfortably.  Objective: Vitals:   07/11/21 0800 07/11/21 0900 07/11/21 1000 07/11/21 1100  BP: (!) 146/77 (!) 134/93 (!) 164/80 (!) 132/56  Pulse: 61 67 80 80  Resp: 15 16    Temp:    (!) 97.5 F (36.4 C)  TempSrc:      SpO2: 100% 100% 100% 100%  Weight:      Height:        Intake/Output Summary (Last 24 hours) at 07/11/2021 1212 Last data filed at 07/11/2021 1106 Gross per 24 hour  Intake 5472.14 ml  Output 2305 ml  Net 3167.14 ml   Physical Exam: A&Ox3, NAD ENT:  NG in place. Sumping relatively clear drainage. CV: RRR Pulmonary: CTA Bilaterally Abdomen: Soft, Non-tender, Non-distended,   Incision: OR dressing intact clean and dry. GU:   Foley: Intact draining clear urine Right groin:  Incision: OR dressing intact clean and dry Left groin:  Incision: OR dressing intact clean and dry Vascular:  Right lower extremity: Thigh soft.  Calf soft.  Extremities warm distally to toes.  Good capillary refill.  Motor/sensory is intact.  Soft.  Warm.  There is no acute vascular compromise noted on exam at this time.  Left lower extremity: Thigh soft.  Calf soft.  Extremities warm distally to toes.  Good capillary refill.  Motor/sensory is intact.  Soft.  Warm.  There is no acute vascular compromise noted on exam at this time.  Laboratory: CBC    Component Value Date/Time   WBC 21.6 (H)  07/11/2021 0415   HGB 13.8 07/11/2021 0415   HCT 39.0 07/11/2021 0415   PLT 214 07/11/2021 0415   BMET    Component Value Date/Time   NA 137 07/11/2021 0415   K 4.1 07/11/2021 0415   CL 113 (H) 07/11/2021 0415   CO2 19 (L) 07/11/2021 0415   GLUCOSE 206 (H) 07/11/2021 0415   BUN 19 07/11/2021 0415   CREATININE 1.13 07/11/2021 0415   CALCIUM 7.1 (L) 07/11/2021 0415   GFRNONAA >60 07/11/2021 0415   Assessment/Planning: The patient is a 60 year old male with aortoiliac disease s/p aorta bifemoral bypass - POD#1  1) No acute issues overnight. Patient is already sitting in a chair this AM. 2) Patient is afebrile with stable vital signs and making good urine.  We will leave the Foley in one more day as the patient did undergo a large surgery.  3) 2g drop in hemoglobin which is expected due to the nature of his recent surgery.  Still in normal range.  Slight increase in creatinine however this is also still in the normal range.  Will decrease IV fluids to 75 and stop them at midnight tonight.  AM CBC and BMP. 4) Will keep NG tube in 2-day as postop ileus after an aortobifem is quite common.  Patient has not passed flatus as of yet. 5) On aspirin, statin for medical management. 6) Lovenox  for DVT prophylaxis. 7) Okay for the patient to start ambulating out of bed to chair, PT/OT.  Discussed with Dr. Wallis Mart Tahari Clabaugh PA-C 07/11/2021 12:12 PM

## 2021-07-11 NOTE — TOC Initial Note (Signed)
Transition of Care [facility]) - Initial/Assessment Note    Patient Details  Name: Albert [name] MRN: [id number] Date of Birth: January 24, 1961  Transition of Care [facility]) CM/SW Contact:    Hetty Ely, RN Phone Number: 07/11/2021, 2:10 PM  Clinical Narrative: Spoke with patient, who is alert and oriented x3 voices independent with ADL's, no assisted device used. Independent with cooking and shopping, however neighbor provides transportation for shopping and medical visits at the [facility]. Medication pharmacy is medication management. Discharge plan is to move in with brother and daughter-in-law. No TOC barriers identified at this time, will continue to track for discharge needs.                  Expected Discharge Plan: Home/Self Care Barriers to Discharge: Continued Medical Work up   Patient Goals and CMS Choice Patient states their goals for this hospitalization and ongoing recovery are:: To discharge to brother and sister-in-law home.   Choice offered to / list presented to : NA  Expected Discharge Plan and Services Expected Discharge Plan: Home/Self Care     Post Acute Care Choice: NA Living arrangements for the past 2 months: Apartment                                      Prior Living Arrangements/Services Living arrangements for the past 2 months: Apartment Lives with:: Self Patient language and need for interpreter reviewed:: Yes Do you feel safe going back to the place where you live?: Yes      Need for Family Participation in Patient Care: No (Comment) Care giver support system in place?: Yes (comment)   Criminal Activity/Legal Involvement Pertinent to Current Situation/Hospitalization: No - Comment as needed  Activities of Daily Living      Permission Sought/Granted Permission sought to share information with : Case Manager                Emotional Assessment Appearance:: Appears stated age Attitude/Demeanor/Rapport: Engaged Affect  (typically observed): Accepting Orientation: : Oriented to Self, Oriented to Place, Oriented to  Time, Oriented to Situation Alcohol / Substance Use: Not Applicable Psych Involvement: No (comment)  Admission diagnosis:  Atherosclerosis of artery of extremity with rest pain [facility]) [I70.229] Patient Active Problem List   Diagnosis Date Noted   Atherosclerosis of artery of extremity with rest pain ([name]) 07/10/2021   Atherosclerosis of native arteries of extremity with rest pain ([name]) 03/13/2021   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   Hypertension    Hypokalemia    Lumbar radiculopathy, chronic 02/18/2021   Seizure disorder ([name]) 12/05/2020   Tobacco dependence 12/05/2020   PCP:  Marisue Ivan, MD Pharmacy:   [id number] [place], [place] - [address] AT [facility] [address] [place] [address] Phone: [phone] Fax: [phone]  [facility] [address] [place] - [address] [place] [address] Phone: [phone] Fax: [phone]  [facility] [address] [place] [address] Phone: [phone] Fax: [phone]     Social Determinants of Health (SDOH) Interventions    Readmission Risk Interventions No flowsheet data found.

## 2021-07-11 NOTE — Evaluation (Signed)
Occupational Therapy Evaluation Patient Details Name: Albert [name] MRN: [id number] DOB: [birth year] Today's Date: 07/11/2021   History of Present Illness Per vascular. 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  He has had an extensive preoperative evaluation and was felt to be adequate risk for proceeding with aortobifemoral bypass.  He continues to have rest pain in both feet.  He is extremely limited in his walking." 10/5 1. underwent Justarenal aorta to right external iliac and left femoral bypass 2. Left common femoral, profunda femoris, and superficial femoral artery endarterectomies 3. Right external iliac and distal common iliac endarterectomies and removal of previous stent in same location 4. Aortic endarterectomy up to the level of the left renal artery   Clinical Impression   Pt was seen for OT evaluation this date. Prior to hospital admission, pt was independent, living in an attic apartment with 15 steps to enter. Pt reports he will be going to stay with his brother and sister in law after this hospitalization while recovering. Brother's home is a 1 story home with 2+2 steps from back deck to enter and pt will have access to a walk-in shower with built in bench as needed. Currently pt demonstrates impairments in pain, balance, and activity tolerance as described below (See OT problem list) which functionally limit his ability to perform ADL/self-care tasks. Pt currently requires MIN A for LB ADL tasks 2/2 significant pain with any bending forward due to the location of the incision sites, making LB dressing and bathing difficult to initiate over his feet. Pt instructed in AE/DME for LB ADL including a reacher, home/routines modifications to maximize safety (I.e., sitting for a shower versus standing), and falls prevention. Pt verbalized understanding and appreciative of instruction.   Pt requested OT speak with his brother over the phone. OT  relayed session and recommendations for therapy and equipment to the brother. The brother reported he will check to see if they have access to any of the recommended equipment and will let us know. Pt would benefit from skilled OT services to address noted impairments and functional limitations (see below for any additional details) in order to maximize safety and independence while minimizing falls risk and caregiver burden. Upon hospital discharge, recommend HHOT to maximize pt safety and return to functional independence during meaningful occupations of daily life.   Recommendations for follow up therapy are one component of a multi-disciplinary discharge planning process, led by the attending physician.  Recommendations may be updated based on patient status, additional functional criteria and insurance authorization.   Follow Up Recommendations  Home health OT;Other (comment) (initial supervision for ADL/mobility)    Equipment Recommendations  3 in 1 bedside commode;Tub/shower seat;Other (comment) (reacher, long handled sponge)    Recommendations for Other Services       Precautions / Restrictions Precautions Precautions: Fall Restrictions Weight Bearing Restrictions: Yes LLE Weight Bearing: Weight bearing as tolerated      Mobility Bed Mobility      General bed mobility comments: NT, up in recliner    Transfers          General transfer comment: deferred 2/2 pain    Balance Overall balance assessment: Needs assistance Sitting-balance support: No upper extremity supported;Feet supported Sitting balance-Leahy Scale: Good                                ADL either performed or assessed with clinical judgement  ADL Overall ADL's : Needs assistance/impaired                                       General ADL Comments: Pt currently requires MIN A for LB ADL 2/2 significant pain with attempt to bend forward 2/2 abdominal incision and L groin  incision. Set up/supervision for UB ADL for safety 2/2 NG tube/lines/leads.     Vision         Perception     Praxis      Pertinent Vitals/Pain Pain Assessment: 0-10 Pain Score: 10-Worst pain ever Faces Pain Scale: Hurts whole lot Pain Location: Abdomen and L groin near incisions Pain Descriptors / Indicators: Grimacing;Guarding;Aching Pain Intervention(s): Limited activity within patient's tolerance;Monitored during session;Premedicated before session;Repositioned     Hand Dominance     Extremity/Trunk Assessment Upper Extremity Assessment Upper Extremity Assessment: Overall WFL for tasks assessed   Lower Extremity Assessment Lower Extremity Assessment: LLE deficits/detail LLE Deficits / Details: No formal MMT performed due to increased pain, grossly 3/5 for all LE joints. LLE: Unable to fully assess due to pain LLE Sensation: WNL LLE Coordination: WNL       Communication Communication Communication: No difficulties   Cognition Arousal/Alertness: Awake/alert Behavior During Therapy: WFL for tasks assessed/performed Overall Cognitive Status: Within Functional Limits for tasks assessed                                     General Comments       Exercises Other Exercises Other Exercises: Pt instructed in use of pillow to brace against his abdomen/chest for coughing/sneezing to minimize jarring/pain. Other Exercises: Pt instructed in home/routines modifications, AE/DME for ADL, falls prevention strategies.   Shoulder Instructions      Home Living Family/patient expects to be discharged to:: Private residence Living Arrangements: Alone Available Help at Discharge: Family;Available PRN/intermittently Type of Home: Apartment Home Access: Stairs to enter Entrance Stairs-Number of Steps: 1   Home Layout: Two level;Laundry or work area in basement;Bed/bath upstairs Alternate Teacher, music of Steps: 15 Alternate Level Stairs-Rails: Right                Additional Comments: Pt reports living upstairs in a lofted apartment wtih 15 steps up to his bedroom. Pt reports that after discharge he may plan to go live with brother. Brother's house is 1 story, 2 steps + 2 steps to enter from back deck, access to tub/shower and a walk-in shower with built in bench. Spoke with brother briefly by phone and he reports he may have access to some DME. He will check and let us know.      Prior Functioning/Environment Level of Independence: Independent        Comments: Pt reports being independent at baseline, has friends who drive him. Not currently working. Has applied for Medicaid and disability, with applications in review.        OT Problem List: Pain;Impaired balance (sitting and/or standing);Decreased knowledge of use of DME or AE      OT Treatment/Interventions: Self-care/ADL training;Therapeutic exercise;Therapeutic activities;Balance training;Patient/family education;DME and/or AE instruction    OT Goals(Current goals can be found in the care plan section) Acute Rehab OT Goals Patient Stated Goal: have less pain and be more independent OT Goal Formulation: With patient Time For Goal Achievement: 07/25/21 Potential to Achieve  Goals: Good  OT Frequency: Min 2X/week   Barriers to D/C:            Co-evaluation              AM-PAC OT "6 Clicks" Daily Activity     Outcome Measure Help from another person eating meals?: None (able to eat ice chips indep, NPO otherwise) Help from another person taking care of personal grooming?: None Help from another person toileting, which includes using toliet, bedpan, or urinal?: A Lot Help from another person bathing (including washing, rinsing, drying)?: A Little Help from another person to put on and taking off regular upper body clothing?: A Little Help from another person to put on and taking off regular lower body clothing?: A Little 6 Click Score: 19   End of Session     Activity Tolerance: Patient limited by pain Patient left: in chair;with call bell/phone within reach  OT Visit Diagnosis: Other abnormalities of gait and mobility (R26.89);History of falling (Z91.81);Pain Pain - Right/Left: Left Pain - part of body: Hip (groin, abdomen)                Time: 5809-9833 OT Time Calculation (min): 33 min Charges:  OT General Charges $OT Visit: 1 Visit OT Evaluation $OT Eval Moderate Complexity: 1 Mod OT Treatments $Self Care/Home Management : 23-37 mins  Arman Filter., MPH, MS, OTR/L ascom [phone] 07/11/21, 4:38 PM

## 2021-07-12 LAB — CBC
HCT: 37 % — ABNORMAL LOW (ref 39.0–52.0)
Hemoglobin: 12.9 g/dL — ABNORMAL LOW (ref 13.0–17.0)
MCH: 31.9 pg (ref 26.0–34.0)
MCHC: 34.9 g/dL (ref 30.0–36.0)
MCV: 91.6 fL (ref 80.0–100.0)
Platelets: 240 10*3/uL (ref 150–400)
RBC: 4.04 MIL/uL — ABNORMAL LOW (ref 4.22–5.81)
RDW: 14.6 % (ref 11.5–15.5)
WBC: 20.4 10*3/uL — ABNORMAL HIGH (ref 4.0–10.5)
nRBC: 0 % (ref 0.0–0.2)

## 2021-07-12 LAB — BASIC METABOLIC PANEL
Anion gap: 6 (ref 5–15)
BUN: 16 mg/dL (ref 6–20)
CO2: 22 mmol/L (ref 22–32)
Calcium: 8.1 mg/dL — ABNORMAL LOW (ref 8.9–10.3)
Chloride: 108 mmol/L (ref 98–111)
Creatinine, Ser: 0.95 mg/dL (ref 0.61–1.24)
GFR, Estimated: >60 mL/min (ref >60)
Glucose, Bld: 108 mg/dL — ABNORMAL HIGH (ref 70–99)
Potassium: 3.8 mmol/L (ref 3.5–5.1)
Sodium: 136 mmol/L (ref 135–145)

## 2021-07-12 LAB — SURGICAL PATHOLOGY

## 2021-07-12 LAB — MAGNESIUM: Magnesium: 2.1 mg/dL (ref 1.7–2.4)

## 2021-07-12 MED ORDER — KETOROLAC TROMETHAMINE 30 MG/ML IJ SOLN
30.0000 mg | Freq: Four times a day (QID) | INTRAMUSCULAR | Status: AC
  Administered 2021-07-12 – 2021-07-13 (×4): 30 mg via INTRAVENOUS
  Filled 2021-07-12 (×4): qty 1

## 2021-07-12 MED ORDER — SODIUM CHLORIDE 0.9 % IV SOLN
INTRAVENOUS | Status: DC | PRN
  Administered 2021-07-12: 250 mL via INTRAVENOUS

## 2021-07-12 NOTE — Progress Notes (Signed)
Occupational Therapy Treatment Patient Details Name: Albert [name] MRN: [id number] DOB: 07-Mar-1961 Today's Date: 07/12/2021   History of present illness Per vascular. 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  He has had an extensive preoperative evaluation and was felt to be adequate risk for proceeding with aortobifemoral bypass.  He continues to have rest pain in both feet.  He is extremely limited in his walking." 10/5 1. underwent Justarenal aorta to right external iliac and left femoral bypass 2. Left common femoral, profunda femoris, and superficial femoral artery endarterectomies 3. Right external iliac and distal common iliac endarterectomies and removal of previous stent in same location 4. Aortic endarterectomy up to the level of the left renal artery   OT comments  Pt seen for OT tx this date focused on AE training for LB dressing. Pt instructed in using a reacher for LB dressing to initiate threading of clothing over feet to minimize bending at the waist/hips in order to minimize abdominal and groin pain/discomfort. OT utilized Journalist, newspaper. Pt return demonstrated technique with reacher for simulated LB dressing task in sitting with MIN VC for recall/sequencing. With 2nd attempt, pt improved to PRN VC. Pt still requiring MIN A ultimately to complete LB dressing task. Pt will benefit from additional skilled OT services to continue instruction with AE/DME for ADL to maximize safety/indep and minimize risk of falls. Continue to recommend HHOT.   Recommendations for follow up therapy are one component of a multi-disciplinary discharge planning process, led by the attending physician.  Recommendations may be updated based on patient status, additional functional criteria and insurance authorization.    Follow Up Recommendations  Home health OT;Other (comment) (initial supervision for ADL/mobility)    Equipment  Recommendations  3 in 1 bedside commode;Tub/shower seat;Other (comment) (reacher, long handled sponge)    Recommendations for Other Services      Precautions / Restrictions Precautions Precautions: Fall Restrictions Weight Bearing Restrictions: Yes LLE Weight Bearing: Weight bearing as tolerated       Mobility Bed Mobility               General bed mobility comments: NT, up in recliner    Transfers Overall transfer level: Needs assistance   Transfers: Sit to/from Stand Sit to Stand: Supervision              Balance Overall balance assessment: Needs assistance Sitting-balance support: Feet supported;Bilateral upper extremity supported Sitting balance-Leahy Scale: Good     Standing balance support: Bilateral upper extremity supported;During functional activity Standing balance-Leahy Scale: Fair                             ADL either performed or assessed with clinical judgement   ADL Overall ADL's : Needs assistance/impaired                     Lower Body Dressing: Minimal assistance;Sit to/from stand;With adaptive equipment Lower Body Dressing Details (indicate cue type and reason): Pt instructed in AE for LB dressing with verbal instruction and visual demo. Pt trialed use of reacher for simulated LB dressing task in sitting with MIN VC for recall/sequencing. With 2nd attempt, pt improved to PRN VC.                     Vision       Perception     Praxis  Cognition Arousal/Alertness: Awake/alert Behavior During Therapy: WFL for tasks assessed/performed Overall Cognitive Status: Within Functional Limits for tasks assessed                                          Exercises Other Exercises Other Exercises: AE edu/training for LB dressing   Shoulder Instructions       General Comments      Pertinent Vitals/ Pain       Pain Assessment: Faces Faces Pain Scale: Hurts even more Pain Location: R  lower abdomen & incision with bending Pain Descriptors / Indicators: Grimacing;Guarding Pain Intervention(s): Limited activity within patient's tolerance;Monitored during session;Repositioned  Home Living                                          Prior Functioning/Environment              Frequency  Min 2X/week        Progress Toward Goals  OT Goals(current goals can now be found in the care plan section)  Progress towards OT goals: Progressing toward goals  Acute Rehab OT Goals Patient Stated Goal: have less pain and be more independent OT Goal Formulation: With patient Time For Goal Achievement: 07/25/21 Potential to Achieve Goals: Good  Plan Discharge plan remains appropriate;Frequency remains appropriate    Co-evaluation                 AM-PAC OT "6 Clicks" Daily Activity     Outcome Measure   Help from another person eating meals?: None Help from another person taking care of personal grooming?: None Help from another person toileting, which includes using toliet, bedpan, or urinal?: A Little Help from another person bathing (including washing, rinsing, drying)?: A Lot Help from another person to put on and taking off regular upper body clothing?: None Help from another person to put on and taking off regular lower body clothing?: A Little 6 Click Score: 20    End of Session    OT Visit Diagnosis: Other abnormalities of gait and mobility (R26.89);History of falling (Z91.81);Pain Pain - Right/Left: Left Pain - part of body: Hip   Activity Tolerance Patient tolerated treatment well   Patient Left in chair;with call bell/phone within reach   Nurse Communication          Time: 5643-3295 OT Time Calculation (min): 17 min  Charges: OT General Charges $OT Visit: 1 Visit OT Treatments $Self Care/Home Management : 8-22 mins  Arman Filter., MPH, MS, OTR/L ascom [phone] 07/12/21, 3:45 PM

## 2021-07-12 NOTE — Progress Notes (Signed)
Physical Therapy Treatment Patient Details Name: Albert [name] MRN: [id number] DOB: [birth year] Today's Date: 07/12/2021   History of Present Illness Per vascular. 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  He has had an extensive preoperative evaluation and was felt to be adequate risk for proceeding with aortobifemoral bypass.  He continues to have rest pain in both feet.  He is extremely limited in his walking." 10/5 1. underwent Justarenal aorta to right external iliac and left femoral bypass 2. Left common femoral, profunda femoris, and superficial femoral artery endarterectomies 3. Right external iliac and distal common iliac endarterectomies and removal of previous stent in same location 4. Aortic endarterectomy up to the level of the left renal artery    PT Comments    Pt seen for PT tx with pt agreeable. Pt endorses 10/10 pain at beginning of session. Pt is able to increase gait distances but still only ambulates a max of 30 ft at a time with RW & CGA. Pt endorses dizziness with gait but BP now low. Pt reports he plans to d/c to his brother's house where he has access to a ramped entrance. Will continue to follow pt acutely to progress gait with LRAD.  BP after first gait trial: 149/73 mmHg (MAP 94) BP after 2nd gait trial: 138/69 mmHg (MAP 89)    Recommendations for follow up therapy are one component of a multi-disciplinary discharge planning process, led by the attending physician.  Recommendations may be updated based on patient status, additional functional criteria and insurance authorization.  Follow Up Recommendations  Home health PT;Supervision/Assistance - 24 hour;Supervision for mobility/OOB     Equipment Recommendations  Rolling walker with 5" wheels    Recommendations for Other Services       Precautions / Restrictions Precautions Precautions: Fall Restrictions Weight Bearing Restrictions: Yes LLE Weight Bearing:  Weight bearing as tolerated     Mobility  Bed Mobility Overal bed mobility: Needs Assistance Bed Mobility: Supine to Sit     Supine to sit: Supervision;HOB elevated     General bed mobility comments: use of bed rails, cuing to use bed rails to scoot to EOB once sitting    Transfers Overall transfer level: Needs assistance Equipment used: Rolling walker (2 wheeled) Transfers: Sit to/from Stand Sit to Stand: Supervision         General transfer comment: cuing for safe hand placement  Ambulation/Gait Ambulation/Gait assistance: Min guard Gait Distance (Feet): 30 Feet (+ 20 ft) Assistive device: Rolling walker (2 wheeled) Gait Pattern/deviations: Decreased step length - left;Decreased stride length;Decreased step length - right Gait velocity: decreased   General Gait Details: cuing to push RW vs picking it up   Stairs             Wheelchair Mobility    Modified Rankin (Stroke Patients Only)       Balance Overall balance assessment: Needs assistance Sitting-balance support: Feet supported;Bilateral upper extremity supported Sitting balance-Leahy Scale: Good Sitting balance - Comments: supervision sitting EOB   Standing balance support: Bilateral upper extremity supported;During functional activity Standing balance-Leahy Scale: Fair Standing balance comment: BUE support on RW                            Cognition Arousal/Alertness: Awake/alert Behavior During Therapy: WFL for tasks assessed/performed Overall Cognitive Status: Within Functional Limits for tasks assessed  Exercises      General Comments General comments (skin integrity, edema, etc.): SPO2 reading as low as 69% but very poor pleth waveform & pt only c/o mild SOB, quickly recovers to >90% with nurse made aware, pt on room air during session      Pertinent Vitals/Pain Pain Assessment: 0-10 Pain Score: 10-Worst pain ever  (pt self reports 10/10 pain at rest but able to mobilize well) Pain Location: R lower abdomen & incision Pain Descriptors / Indicators: Guarding;Discomfort Pain Intervention(s): Monitored during session    Home Living                      Prior Function            PT Goals (current goals can now be found in the care plan section) Acute Rehab PT Goals Patient Stated Goal: have less pain and be more independent PT Goal Formulation: With patient Time For Goal Achievement: 07/25/21 Potential to Achieve Goals: Good Progress towards PT goals: Progressing toward goals    Frequency    Min 2X/week      PT Plan      Co-evaluation              AM-PAC PT "6 Clicks" Mobility   Outcome Measure  Help needed turning from your back to your side while in a flat bed without using bedrails?: None Help needed moving from lying on your back to sitting on the side of a flat bed without using bedrails?: A Little Help needed moving to and from a bed to a chair (including a wheelchair)?: A Little Help needed standing up from a chair using your arms (e.g., wheelchair or bedside chair)?: A Little Help needed to walk in hospital room?: A Little Help needed climbing 3-5 steps with a railing? : A Lot 6 Click Score: 18    End of Session   Activity Tolerance: Patient tolerated treatment well Patient left: in chair;with call bell/phone within reach Nurse Communication: Mobility status (vitals) PT Visit Diagnosis: Unsteadiness on feet (R26.81);Muscle weakness (generalized) (M62.81);History of falling (Z91.81)     Time: 6553-7482 PT Time Calculation (min) (ACUTE ONLY): 25 min  Charges:  $Therapeutic Activity: 23-37 mins                     Aleda Grana, PT, DPT 07/12/21, 11:54 AM    Sandi Mariscal 07/12/2021, 11:53 AM

## 2021-07-12 NOTE — Progress Notes (Signed)
[facility] Daily Progress Note  07/10/21:    1.  Juxtarenal aorta to right external iliac and left femoral bypass with 14 mm diameter proximal 7 mm diameter distal bifurcated Dacron graft 2.   Left common femoral, profunda femoris, and superficial femoral artery endarterectomies 3.   Right external iliac and distal common iliac endarterectomies and removal of previous stent in same location  4.   Aortic endarterectomy up to the level of the left renal arter  Subjective: Patient doing well today.  Still with some incisional discomfort which is to be expected.  He has been working with PT and OT.  Objective: Vitals:   07/12/21 0400 07/12/21 0500 07/12/21 0600 07/12/21 0700  BP: 126/63 135/67 (!) 143/69 (!) 145/72  Pulse: 67 65 94 67  Resp: (!) 27   (!) 22  Temp:    98 F (36.7 C)  TempSrc:    Oral  SpO2: 97% 98% 98% 98%  Weight:      Height:        Intake/Output Summary (Last 24 hours) at 07/12/2021 1249 Last data filed at 07/12/2021 1031 Gross per 24 hour  Intake 534 ml  Output 2725 ml  Net -2191 ml   Physical Exam: A&Ox3, NAD ENT:  NG in place.  Clamping trial. CV: RRR Pulmonary: CTA Bilaterally Abdomen: Soft, Non-tender, Non-distended,   Incision: OR dressing intact clean and dry. Right groin:  Incision: OR dressing intact clean and dry Left groin:  Incision: OR dressing intact clean and dry Vascular:  Right lower extremity: Thigh soft.  Calf soft.  Extremities warm distally to toes.  Good capillary refill.  Motor/sensory is intact.  Soft.  Warm.  There is no acute vascular compromise noted on exam at this time.  Left lower extremity: Thigh soft.  Calf soft.  Extremities warm distally to toes.  Good capillary refill.  Motor/sensory is intact.  Soft.  Warm.  There is no acute vascular compromise noted on exam at this time.   Laboratory: CBC    Component Value Date/Time   WBC 20.4 (H) 07/12/2021 0455   HGB 12.9 (L) 07/12/2021 0455   HCT 37.0  (L) 07/12/2021 0455   PLT 240 07/12/2021 0455   BMET    Component Value Date/Time   NA 136 07/12/2021 0455   K 3.8 07/12/2021 0455   CL 108 07/12/2021 0455   CO2 22 07/12/2021 0455   GLUCOSE 108 (H) 07/12/2021 0455   BUN 16 07/12/2021 0455   CREATININE 0.95 07/12/2021 0455   CALCIUM 8.1 (L) 07/12/2021 0455   GFRNONAA >60 07/12/2021 0455   Assessment/Planning: The patient is a 60 year old male with aortoiliac disease s/p aorta bifemoral bypass - POD#2  1) No acute issues overnight. Patient is already sitting in a chair this AM. 2) Patient is afebrile with stable vital signs and making good urine.  Will remove Foley. 3) 1 g drop in hemoglobin.  Could be slightly dilutional.  AM CBC asymptomatic at this time. 4) NG clamping trial this AM. 5) On aspirin, statin for medical management. 6) Lovenox for DVT prophylaxis. 7) Okay for the patient to start ambulating out of bed to chair, PT/OT.  Discussed with Dr. Wallis Mart Manolito Jurewicz PA-C 07/12/2021 12:49 PM

## 2021-07-13 ENCOUNTER — Encounter: Payer: Self-pay | Admitting: Vascular Surgery

## 2021-07-13 LAB — CBC
HCT: 36.7 % — ABNORMAL LOW (ref 39.0–52.0)
Hemoglobin: 12.1 g/dL — ABNORMAL LOW (ref 13.0–17.0)
MCH: 30.8 pg (ref 26.0–34.0)
MCHC: 33 g/dL (ref 30.0–36.0)
MCV: 93.4 fL (ref 80.0–100.0)
Platelets: 238 10*3/uL (ref 150–400)
RBC: 3.93 MIL/uL — ABNORMAL LOW (ref 4.22–5.81)
RDW: 14.4 % (ref 11.5–15.5)
WBC: 13.3 10*3/uL — ABNORMAL HIGH (ref 4.0–10.5)
nRBC: 0 % (ref 0.0–0.2)

## 2021-07-13 LAB — BASIC METABOLIC PANEL
Anion gap: 6 (ref 5–15)
BUN: 13 mg/dL (ref 6–20)
CO2: 24 mmol/L (ref 22–32)
Calcium: 8.1 mg/dL — ABNORMAL LOW (ref 8.9–10.3)
Chloride: 101 mmol/L (ref 98–111)
Creatinine, Ser: 0.85 mg/dL (ref 0.61–1.24)
GFR, Estimated: >60 mL/min (ref >60)
Glucose, Bld: 49 mg/dL — ABNORMAL LOW (ref 70–99)
Potassium: 3.7 mmol/L (ref 3.5–5.1)
Sodium: 131 mmol/L — ABNORMAL LOW (ref 135–145)

## 2021-07-13 LAB — MAGNESIUM: Magnesium: 2 mg/dL (ref 1.7–2.4)

## 2021-07-13 NOTE — Progress Notes (Signed)
3 Days Post-Op   Subjective/Chief Complaint: Patient is up and about. He feels slight soreness around his abdomen. Denies any fevers or chills.   Objective: Vital signs in last 24 hours: Temp:  [98 F (36.7 C)-98.4 F (36.9 C)] 98.4 F (36.9 C) (10/08 0728) Pulse Rate:  [56-69] 56 (10/08 0728) Resp:  [15-20] 16 (10/08 0728) BP: (98-138)/(67-81) 122/67 (10/08 0728) SpO2:  [97 %-100 %] 100 % (10/08 0728) Last BM Date: 07/10/21  Intake/Output from previous day: 10/07 0701 - 10/08 0700 In: 807.3 [P.O.:480; I.V.:11.3; IV Piggyback:316] Out: 575 [Urine:575] Intake/Output this shift: No intake/output data recorded.  General appearance: alert, cooperative, appears older than stated age, and no distress Head: Normocephalic, without obvious abnormality, atraumatic Neck: no adenopathy, no carotid bruit, no JVD, supple, symmetrical, trachea midline, and thyroid not enlarged, symmetric, no tenderness/mass/nodules Back: symmetric, no curvature. ROM normal. No CVA tenderness. Resp: clear to auscultation bilaterally Chest wall: no tenderness Extremities: extremities normal, atraumatic, no cyanosis or edema and no edema, redness or tenderness in the calves or thighs Incision/Wound: Abdominal incision is clean, the left groin incision is clean.  He has positive bowel sounds.  Lab Results:  Recent Labs    07/12/21 0455 07/13/21 0449  WBC 20.4* 13.3*  HGB 12.9* 12.1*  HCT 37.0* 36.7*  PLT 240 238   BMET Recent Labs    07/12/21 0455 07/13/21 0449  NA 136 131*  K 3.8 3.7  CL 108 101  CO2 22 24  GLUCOSE 108* 49*  BUN 16 13  CREATININE 0.95 0.85  CALCIUM 8.1* 8.1*   PT/INR No results for input(s): LABPROT, INR in the last 72 hours. ABG No results for input(s): PHART, HCO3 in the last 72 hours.  Invalid input(s): PCO2, PO2  Studies/Results: No results found.  Anti-infectives: Anti-infectives (From admission, onward)    Start     Dose/Rate Route Frequency Ordered Stop    07/10/21 1800  ceFAZolin (ANCEF) IVPB 2g/100 mL premix        2 g 200 mL/hr over 30 Minutes Intravenous Every 8 hours 07/10/21 1411 07/11/21 0143   07/10/21 0724  ceFAZolin (ANCEF) 2-4 GM/100ML-% IVPB       Note to Pharmacy: Agnes Lawrence  : cabinet override      07/10/21 0724 07/11/21 0143   07/10/21 0600  ceFAZolin (ANCEF) IVPB 2g/100 mL premix        2 g 200 mL/hr over 30 Minutes Intravenous On call to O.R. 07/10/21 0306 07/10/21 1148       Assessment/Plan: s/p Procedure(s): Aortic Endarterectomy; Right Iliac Endarterectomy; Left Common Femoral, Profundo-femoral and Superficial Endarterectomy; Aorta to Right External Iliac Bypass and Left Fermoral Bypass (Bilateral) APPLICATION OF CELL SAVER (N/A) INSERTION CENTRAL LINE ADULT (Right) Advance diet  LOS: 3 days    Louisa Second 07/13/2021

## 2021-07-13 NOTE — TOC Progression Note (Addendum)
Transition of Care [facility]) - Progression Note    Patient Details  Name: Keita Valley MRN: [id number] Date of Birth: [birth year]  Transition of [facility]) CM/SW Contact  Liliana Cline, LCSW Phone Number: 07/13/2021, 11:56 AM  Clinical Narrative:   Spoke to patient about PT/OT recs. Patient stated he will be living with his brother and sister in law on [address] upon discharge for supervision. They have a ramp to enter the home. He was previously renting an apartment from his brother which had stairs to enter. Patient has no HH history, is interested in [facility], no agency preference. CSW is reaching out to [facility] agencies to find coverage. Asked patient if he would be able to go to OPPT if [facility] can not be arranged, he said TOC would need to talk to his brother about this. Patient agreeable to RW and 3 in 1 being ordered. Asked PA for orders.     Expected Discharge Plan: Home/Self Care Barriers to Discharge: Continued Medical Work up  Expected Discharge Plan and Services Expected Discharge Plan: Home/Self Care     Post Acute Care Choice: NA Living arrangements for the past 2 months: Apartment                                       Social Determinants of Health (SDOH) Interventions    Readmission Risk Interventions No flowsheet data found.

## 2021-07-14 MED ORDER — LEVETIRACETAM 750 MG PO TABS
750.0000 mg | ORAL_TABLET | Freq: Two times a day (BID) | ORAL | Status: DC
  Filled 2021-07-14 (×2): qty 1

## 2021-07-14 MED ORDER — SODIUM CHLORIDE 0.9 % IV SOLN
750.0000 mg | Freq: Two times a day (BID) | INTRAVENOUS | Status: DC
  Administered 2021-07-15 (×2): 750 mg via INTRAVENOUS
  Filled 2021-07-14 (×4): qty 7.5

## 2021-07-14 MED ORDER — GABAPENTIN 100 MG PO CAPS
100.0000 mg | ORAL_CAPSULE | Freq: Three times a day (TID) | ORAL | Status: DC
  Administered 2021-07-14 – 2021-07-17 (×8): 100 mg via ORAL
  Filled 2021-07-14 (×8): qty 1

## 2021-07-14 NOTE — Progress Notes (Signed)
4 Days Post-Op   Subjective/Chief Complaint: Patient is doing well and is walking around.  Has not had a BP, but is passing gas well.  He is concerned about going home since he lives alone.   Objective: Vital signs in last 24 hours: Temp:  [97.6 F (36.4 C)-98.1 F (36.7 C)] 98 F (36.7 C) (10/09 0800) Pulse Rate:  [66-76] 66 (10/09 0800) Resp:  [18-20] 18 (10/09 0800) BP: (113-126)/(69-74) 119/69 (10/09 0800) SpO2:  [99 %-100 %] 100 % (10/09 0800) Last BM Date: 07/10/21  Intake/Output from previous day: 10/08 0701 - 10/09 0700 In: 240 [P.O.:240] Out: 1250 [Urine:1250] Intake/Output this shift: Total I/O In: 240 [P.O.:240] Out: -   General appearance: alert, cooperative, and appears stated age Resp: clear to auscultation bilaterally Cardio: regular rate and rhythm, S1, S2 normal, no murmur, click, rub or gallop Extremities: extremities normal, atraumatic, no cyanosis or edema Incision/Wound: abdominal incision looks good, the left groin incision looks good. Pedal pulses are weakly palpable.   Lab Results:  Recent Labs    07/12/21 0455 07/13/21 0449  WBC 20.4* 13.3*  HGB 12.9* 12.1*  HCT 37.0* 36.7*  PLT 240 238   BMET Recent Labs    07/12/21 0455 07/13/21 0449  NA 136 131*  K 3.8 3.7  CL 108 101  CO2 22 24  GLUCOSE 108* 49*  BUN 16 13  CREATININE 0.95 0.85  CALCIUM 8.1* 8.1*   PT/INR No results for input(s): LABPROT, INR in the last 72 hours. ABG No results for input(s): PHART, HCO3 in the last 72 hours.  Invalid input(s): PCO2, PO2  Studies/Results: No results found.  Anti-infectives: Anti-infectives (From admission, onward)    Start     Dose/Rate Route Frequency Ordered Stop   07/10/21 1800  ceFAZolin (ANCEF) IVPB 2g/100 mL premix        2 g 200 mL/hr over 30 Minutes Intravenous Every 8 hours 07/10/21 1411 07/11/21 0143   07/10/21 0724  ceFAZolin (ANCEF) 2-4 GM/100ML-% IVPB       Note to Pharmacy: Agnes Lawrence  : cabinet override       07/10/21 0724 07/11/21 0143   07/10/21 0600  ceFAZolin (ANCEF) IVPB 2g/100 mL premix        2 g 200 mL/hr over 30 Minutes Intravenous On call to O.R. 07/10/21 0306 07/10/21 1148       Assessment/Plan: s/p Procedure(s): Aortic Endarterectomy; Right Iliac Endarterectomy; Left Common Femoral, Profundo-femoral and Superficial Endarterectomy; Aorta to Right External Iliac Bypass and Left Fermoral Bypass (Bilateral) APPLICATION OF CELL SAVER (N/A) INSERTION CENTRAL LINE ADULT (Right) Plan for discharge tomorrow  LOS: 4 days    Louisa Second 07/14/2021

## 2021-07-14 NOTE — TOC Progression Note (Signed)
Transition of Care [facility]) - Progression Note    Patient Details  Name: Aloysius Heinle MRN: [id number] Date of Birth: 08-Jul-1961  Transition of Care [facility]) CM/SW Contact  Liliana Cline, LCSW Phone Number: 07/14/2021, 1:42 PM  Clinical Narrative:   Pearson Grippe with [facility] accepted patient for PT and OT services.    Expected Discharge Plan: Home/Self Care Barriers to Discharge: Continued Medical Work up  Expected Discharge Plan and Services Expected Discharge Plan: Home/Self Care     Post Acute Care Choice: NA Living arrangements for the past 2 months: Apartment                                       Social Determinants of Health (SDOH) Interventions    Readmission Risk Interventions No flowsheet data found.

## 2021-07-15 ENCOUNTER — Other Ambulatory Visit: Payer: Self-pay

## 2021-07-15 MED ORDER — SORBITOL 70 % SOLN
30.0000 mL | Freq: Every day | Status: DC | PRN
  Filled 2021-07-15: qty 30

## 2021-07-15 MED ORDER — ASPIRIN 81 MG PO CHEW
81.0000 mg | CHEWABLE_TABLET | Freq: Every day | ORAL | Status: DC
  Administered 2021-07-16 – 2021-07-17 (×2): 81 mg via ORAL
  Filled 2021-07-15 (×2): qty 1

## 2021-07-15 MED ORDER — LEVETIRACETAM 750 MG PO TABS
750.0000 mg | ORAL_TABLET | Freq: Two times a day (BID) | ORAL | Status: DC
  Filled 2021-07-15 (×2): qty 1

## 2021-07-15 MED ORDER — ATORVASTATIN CALCIUM 20 MG PO TABS
80.0000 mg | ORAL_TABLET | Freq: Every day | ORAL | Status: DC
  Administered 2021-07-16: 80 mg via ORAL
  Filled 2021-07-15: qty 4

## 2021-07-15 MED ORDER — SENNOSIDES-DOCUSATE SODIUM 8.6-50 MG PO TABS: 1.0000 | ORAL_TABLET | Freq: Every evening | ORAL | Status: DC | PRN

## 2021-07-15 MED ORDER — DOCUSATE SODIUM 50 MG/5ML PO LIQD
100.0000 mg | Freq: Every day | ORAL | Status: DC
  Administered 2021-07-16 – 2021-07-17 (×2): 100 mg via ORAL
  Filled 2021-07-15 (×2): qty 10

## 2021-07-15 MED ORDER — AMLODIPINE BESYLATE 5 MG PO TABS
5.0000 mg | ORAL_TABLET | Freq: Every day | ORAL | Status: DC
  Administered 2021-07-16 – 2021-07-17 (×2): 5 mg via ORAL
  Filled 2021-07-15 (×2): qty 1

## 2021-07-15 MED ORDER — POTASSIUM CHLORIDE 20 MEQ PO PACK: 20.0000 meq | PACK | Freq: Every day | ORAL | Status: DC | PRN

## 2021-07-15 MED ORDER — GUAIFENESIN-DM 100-10 MG/5ML PO SYRP: 15.0000 mL | ORAL_SOLUTION | ORAL | Status: DC | PRN

## 2021-07-15 NOTE — Progress Notes (Signed)
Occupational Therapy Treatment Patient Details Name: Albert [name] MRN: [id number] DOB: 11-06-1960 Today's Date: 07/15/2021   History of present illness Per vascular. 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  He has had an extensive preoperative evaluation and was felt to be adequate risk for proceeding with aortobifemoral bypass.  He continues to have rest pain in both feet.  He is extremely limited in his walking." 10/5 1. underwent Justarenal aorta to right external iliac and left femoral bypass 2. Left common femoral, profunda femoris, and superficial femoral artery endarterectomies 3. Right external iliac and distal common iliac endarterectomies and removal of previous stent in same location 4. Aortic endarterectomy up to the level of the left renal artery   OT comments  Pt seen for OT tx this date. Pt eager to participate although endorses decreased recall of previous instruction. Pt instructed further in use of AE for LB ADL. Pt return demo'd use of reacher to doff pants and then don pants, with Min VC for sequencing to optimize technique and safety. Supervision for STS ADL transfers w/ VC for hand/foot placement to improve comfort and safety. Pt progressing towards goals and would benefit from additional instruction to maximize safety and carryover. Continue to recommend HHOT services upon discharge.    Recommendations for follow up therapy are one component of a multi-disciplinary discharge planning process, led by the attending physician.  Recommendations may be updated based on patient status, additional functional criteria and insurance authorization.    Follow Up Recommendations  Home health OT;Supervision - Intermittent (initial sup/assist)    Equipment Recommendations  3 in 1 bedside commode;Tub/shower seat;Other (comment) (reacher, LH sponge)    Recommendations for Other Services      Precautions / Restrictions  Precautions Precautions: Fall Restrictions Weight Bearing Restrictions: Yes LLE Weight Bearing: Weight bearing as tolerated       Mobility Bed Mobility Overal bed mobility: Modified Independent Bed Mobility: Supine to Sit;Sit to Supine     Supine to sit: Modified independent (Device/Increase time);HOB elevated Sit to supine: Modified independent (Device/Increase time)   General bed mobility comments: increased time/effort 2/2 abdominal soreness    Transfers Overall transfer level: Needs assistance Equipment used: None;Rolling walker (2 wheeled) Transfers: Sit to/from Stand Sit to Stand: Supervision         General transfer comment: supervision wiht and without RW, cues for hand placement to improve safety    Balance Overall balance assessment: Needs assistance Sitting-balance support: Feet supported;No upper extremity supported Sitting balance-Leahy Scale: Normal     Standing balance support: No upper extremity supported;During functional activity Standing balance-Leahy Scale: Fair Standing balance comment: per completed donning of pants over hips in standing with supervision and no LOB                           ADL either performed or assessed with clinical judgement   ADL Overall ADL's : Needs assistance/impaired                     Lower Body Dressing: Supervision/safety;Sit to/from stand;Cueing for safety;With adaptive equipment;Cueing for sequencing Lower Body Dressing Details (indicate cue type and reason): Pt demo'd poor recall of previous OT Session and instruction in AE for LB ADL. Pt instructed further in use of reacher for LB dressing. Pt return demo'd use to doff pants and then don pants, with Min VC for sequencing to optimize technique and safety. Would benefit  from additional instruction to maximize safety and carryover.                     Vision       Perception     Praxis      Cognition Arousal/Alertness:  Awake/alert Behavior During Therapy: WFL for tasks assessed/performed Overall Cognitive Status: No family/caregiver present to determine baseline cognitive functioning                                 General Comments: Pt demo's poor STM/recall of previous instructions        Exercises Other Exercises Other Exercises: AE edu/training for LB dressing   Shoulder Instructions       General Comments      Pertinent Vitals/ Pain       Pain Assessment: 0-10 Pain Score: 7  Pain Location: Abdomen Pain Descriptors / Indicators: Grimacing;Guarding;Operative site guarding;Sore;Pressure Pain Intervention(s): Limited activity within patient's tolerance;Monitored during session;Repositioned  Home Living                                          Prior Functioning/Environment              Frequency  Min 2X/week        Progress Toward Goals  OT Goals(current goals can now be found in the care plan section)  Progress towards OT goals: Progressing toward goals  Acute Rehab OT Goals Patient Stated Goal: have less pain and be more independent OT Goal Formulation: With patient Time For Goal Achievement: 07/25/21 Potential to Achieve Goals: Good  Plan Discharge plan remains appropriate;Frequency remains appropriate    Co-evaluation                 AM-PAC OT "6 Clicks" Daily Activity     Outcome Measure   Help from another person eating meals?: None Help from another person taking care of personal grooming?: None Help from another person toileting, which includes using toliet, bedpan, or urinal?: A Little Help from another person bathing (including washing, rinsing, drying)?: A Little Help from another person to put on and taking off regular upper body clothing?: None Help from another person to put on and taking off regular lower body clothing?: A Little 6 Click Score: 21    End of Session Equipment Utilized During Treatment: Rolling  walker  OT Visit Diagnosis: Other abnormalities of gait and mobility (R26.89);History of falling (Z91.81);Pain Pain - Right/Left: Left Pain - part of body: Hip (and abdomen)   Activity Tolerance Patient tolerated treatment well   Patient Left in bed;with call bell/phone within reach;with bed alarm set   Nurse Communication          Time: 9381-0175 OT Time Calculation (min): 28 min  Charges: OT General Charges $OT Visit: 1 Visit OT Treatments $Self Care/Home Management : 23-37 mins  Arman Filter., MPH, MS, OTR/L ascom [phone] 07/15/21, 4:35 PM

## 2021-07-15 NOTE — Progress Notes (Signed)
Physical Therapy Treatment Patient Details Name: Albert [name] MRN: [id number] DOB: 03/25/1961 Today's Date: 07/15/2021   History of Present Illness Per vascular. 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  He has had an extensive preoperative evaluation and was felt to be adequate risk for proceeding with aortobifemoral bypass.  He continues to have rest pain in both feet.  He is extremely limited in his walking." 10/5 1. underwent Justarenal aorta to right external iliac and left femoral bypass 2. Left common femoral, profunda femoris, and superficial femoral artery endarterectomies 3. Right external iliac and distal common iliac endarterectomies and removal of previous stent in same location 4. Aortic endarterectomy up to the level of the left renal artery    PT Comments    Pt continuing to make progress towards his therapy goals and agreeable to PT treatment today. Pt able to perform bed mobility with additional time. Pt stood from EOB without AD with no loss of balance noted and able to complete some standing exercises without UE assistance. With fatigue, pt does require B UE support on counter or RW to continue with treatment. Pt ambulated 150 ft with intermittent CGA and verbal cues to reduce forward flexed posture. HR 99 bpm throughout gait training. Pt reporting that he doesn't feel comfortable yet going home since his brother works during the day. Recommending home with HHPT. Will continue to work with patient to improve mobility and increase independence.     Recommendations for follow up therapy are one component of a multi-disciplinary discharge planning process, led by the attending physician.  Recommendations may be updated based on patient status, additional functional criteria and insurance authorization.  Follow Up Recommendations  Home health PT;Supervision for mobility/OOB     Equipment Recommendations  Rolling walker with 5"  wheels    Recommendations for Other Services       Precautions / Restrictions Precautions Precautions: Fall Restrictions Weight Bearing Restrictions: Yes LLE Weight Bearing: Weight bearing as tolerated     Mobility  Bed Mobility Overal bed mobility: Independent Bed Mobility: Supine to Sit     Supine to sit: Modified independent (Device/Increase time);HOB elevated     General bed mobility comments: Increased time required due to soreness and pressure in abdomen    Transfers Overall transfer level: Needs assistance Equipment used: None Transfers: Sit to/from Stand Sit to Stand: Supervision         General transfer comment: Pt stood without RW initially; after ambulation and seated rest break pt requesting usage of RW due to fatigue  Ambulation/Gait Ambulation/Gait assistance: Supervision Gait Distance (Feet): 150 Feet Assistive device: Rolling walker (2 wheeled) Gait Pattern/deviations: Step-through pattern;Trunk flexed Gait velocity: decreased   General Gait Details: Step through pattern with increased trunk flexion. Pt requiring 2 standing rest breaks due to reports of fatigue and soreness   Stairs             Wheelchair Mobility    Modified Rankin (Stroke Patients Only)       Balance Overall balance assessment: Needs assistance Sitting-balance support: Feet supported;No upper extremity supported Sitting balance-Leahy Scale: Normal     Standing balance support: No upper extremity supported;During functional activity Standing balance-Leahy Scale: Fair Standing balance comment: Pt able to march in place prior to ambulation without usage of UEs. With fatigue, pt requiring usage of RW to maintain balance  Cognition Arousal/Alertness: Awake/alert Behavior During Therapy: WFL for tasks assessed/performed Overall Cognitive Status: Within Functional Limits for tasks assessed                                         Exercises Other Exercises Other Exercises: Pt instructed on bracing with a pillow to reduce discomfort with coughing. Pt instructed on seated exercises to be performed 2 x 10 each hour to promote circulation and strength Other Exercises: Standing therex: Marching 2 x 20 no UE support. Standing hip abduction with B UE support on counter 1 x 10 each; pt demonstrating increased lateral lean with tactile cues to keep trunk in neutral position and focusing on movement of the LE only.    General Comments        Pertinent Vitals/Pain Pain Assessment: Faces Faces Pain Scale: Hurts little more Pain Location: Abdomen and L knee Pain Descriptors / Indicators: Grimacing;Guarding;Operative site guarding;Sore;Pressure Pain Intervention(s): Limited activity within patient's tolerance;Monitored during session;Repositioned;Relaxation    Home Living                      Prior Function            PT Goals (current goals can now be found in the care plan section) Acute Rehab PT Goals Patient Stated Goal: have less pain and be more independent PT Goal Formulation: With patient Time For Goal Achievement: 07/25/21 Potential to Achieve Goals: Good Progress towards PT goals: Progressing toward goals    Frequency    Min 2X/week      PT Plan Current plan remains appropriate    Co-evaluation              AM-PAC PT "6 Clicks" Mobility   Outcome Measure  Help needed turning from your back to your side while in a flat bed without using bedrails?: None Help needed moving from lying on your back to sitting on the side of a flat bed without using bedrails?: None Help needed moving to and from a bed to a chair (including a wheelchair)?: A Little Help needed standing up from a chair using your arms (e.g., wheelchair or bedside chair)?: A Little Help needed to walk in hospital room?: A Little Help needed climbing 3-5 steps with a railing? : A Lot 6 Click Score:  19    End of Session Equipment Utilized During Treatment: Gait belt Activity Tolerance: Patient tolerated treatment well Patient left: in chair;with call bell/phone within reach;with chair alarm set Nurse Communication: Mobility status PT Visit Diagnosis: Unsteadiness on feet (R26.81);Muscle weakness (generalized) (M62.81);History of falling (Z91.81)     Time: 7096-2836 PT Time Calculation (min) (ACUTE ONLY): 29 min  Charges:  $Gait Training: 8-22 mins $Therapeutic Exercise: 8-22 mins                     Verl Blalock, SPT    Verl Blalock 07/15/2021, 1:12 PM

## 2021-07-15 NOTE — Progress Notes (Signed)
[facility] Daily Progress Note  07/10/21:    1.  Juxtarenal aorta to right external iliac and left femoral bypass with 14 mm diameter proximal 7 mm diameter distal bifurcated Dacron graft 2.   Left common femoral, profunda femoris, and superficial femoral artery endarterectomies 3.   Right external iliac and distal common iliac endarterectomies and removal of previous stent in same location  4.   Aortic endarterectomy up to the level of the left renal arter  Subjective: Patient with continued incisional pain this AM.  No acute issues overnight.  Objective: Vitals:   07/14/21 0800 07/14/21 2038 07/15/21 0501 07/15/21 0731  BP: 119/69 125/66 103/63 113/61  Pulse: 66 69 (!) 59 63  Resp: 18 20 20 18   Temp: 98 F (36.7 C) 98.2 F (36.8 C) 98.3 F (36.8 C) 98.6 F (37 C)  TempSrc: Oral Oral Oral Oral  SpO2: 100% 99% 100% 99%  Weight:      Height:        Intake/Output Summary (Last 24 hours) at 07/15/2021 1113 Last data filed at 07/15/2021 1027 Gross per 24 hour  Intake 1061.89 ml  Output 1200 ml  Net -138.11 ml   Physical Exam: A&Ox3, NAD CV: RRR Pulmonary: CTA Bilaterally Abdomen: Soft, Non-tender, Non-distended,              Incision: Intact clean and dry. Right groin:             Incision: Intact clean and dry. Left groin:             Incision: Intact clean and dry. Vascular:             Right lower extremity: Thigh soft.  Calf soft.  Extremities warm distally to toes.  Good capillary refill.  Motor/sensory is intact.  Soft.  Warm.  There is no acute vascular compromise noted on exam at this time.             Left lower extremity: Thigh soft.  Calf soft.  Extremities warm distally to toes.  Good capillary refill.  Motor/sensory is intact.  Soft.  Warm.  There is no acute vascular compromise noted on exam at this time.   Laboratory: CBC    Component Value Date/Time   WBC 13.3 (H) 07/13/2021 0449   HGB 12.1 (L) 07/13/2021 0449   HCT 36.7 (L)  07/13/2021 0449   PLT 238 07/13/2021 0449   BMET    Component Value Date/Time   NA 131 (L) 07/13/2021 0449   K 3.7 07/13/2021 0449   CL 101 07/13/2021 0449   CO2 24 07/13/2021 0449   GLUCOSE 49 (L) 07/13/2021 0449   BUN 13 07/13/2021 0449   CREATININE 0.85 07/13/2021 0449   CALCIUM 8.1 (L) 07/13/2021 0449   GFRNONAA >60 07/13/2021 0449   Assessment/Planning: The patient is a 60 year old male with aortoiliac disease s/p aorta bifemoral bypass - POD#5   1) No acute issues overnight.  2) Patient is afebrile with stable vital signs and making good urine.   3) On aspirin, statin for medical management. 4) Lovenox for DVT prophylaxis. 5) Okay for the patient to start ambulating out of bed to chair, PT/OT. 6) Patient is very concerned that he is not ready to be discharged home yet.  Possible discharge tomorrow.   Discussed with Dr. 46 Dody Smartt PA-C 07/15/2021 11:13 AM

## 2021-07-15 NOTE — TOC Progression Note (Signed)
Transition of Care [facility]) - Progression Note    Patient Details  Name: Albert [name] MRN: [id number] Date of Birth: Jan 18, 1961  Transition of [facility]) CM/SW Contact  Chapman Fitch, RN Phone Number: 07/15/2021, 2:56 PM  Clinical Narrative:     Referral made to [facility] with [employer] [facility] and [facility]  Expected Discharge Plan: Home/Self Care Barriers to Discharge: Continued Medical Work up  Expected Discharge Plan and Services Expected Discharge Plan: Home/Self Care     Post Acute Care Choice: NA Living arrangements for the past 2 months: Apartment                                       Social Determinants of Health (SDOH) Interventions    Readmission Risk Interventions No flowsheet data found.

## 2021-07-15 NOTE — Plan of Care (Signed)

## 2021-07-16 LAB — BASIC METABOLIC PANEL
Anion gap: 5 (ref 5–15)
BUN: 11 mg/dL (ref 6–20)
CO2: 24 mmol/L (ref 22–32)
Calcium: 8.4 mg/dL — ABNORMAL LOW (ref 8.9–10.3)
Chloride: 105 mmol/L (ref 98–111)
Creatinine, Ser: 0.81 mg/dL (ref 0.61–1.24)
GFR, Estimated: >60 mL/min (ref >60)
Glucose, Bld: 93 mg/dL (ref 70–99)
Potassium: 3.9 mmol/L (ref 3.5–5.1)
Sodium: 134 mmol/L — ABNORMAL LOW (ref 135–145)

## 2021-07-16 LAB — CBC
HCT: 33.1 % — ABNORMAL LOW (ref 39.0–52.0)
Hemoglobin: 11.6 g/dL — ABNORMAL LOW (ref 13.0–17.0)
MCH: 31.6 pg (ref 26.0–34.0)
MCHC: 35 g/dL (ref 30.0–36.0)
MCV: 90.2 fL (ref 80.0–100.0)
Platelets: 344 10*3/uL (ref 150–400)
RBC: 3.67 MIL/uL — ABNORMAL LOW (ref 4.22–5.81)
RDW: 14.1 % (ref 11.5–15.5)
WBC: 9.1 10*3/uL (ref 4.0–10.5)
nRBC: 0 % (ref 0.0–0.2)

## 2021-07-16 MED ORDER — LEVETIRACETAM 750 MG PO TABS
750.0000 mg | ORAL_TABLET | Freq: Two times a day (BID) | ORAL | Status: DC
  Administered 2021-07-16 – 2021-07-17 (×4): 750 mg via ORAL
  Filled 2021-07-16 (×5): qty 1

## 2021-07-16 NOTE — Progress Notes (Signed)
[facility] Daily Progress Note  07/10/21:    1.  Juxtarenal aorta to right external iliac and left femoral bypass with 14 mm diameter proximal 7 mm diameter distal bifurcated Dacron graft 2.   Left common femoral, profunda femoris, and superficial femoral artery endarterectomies 3.   Right external iliac and distal common iliac endarterectomies and removal of previous stent in same location  4.   Aortic endarterectomy up to the level of the left renal artery  Subjective: Patient with continued but improved incisional discomfort.  No acute issues overnight.  Objective: Vitals:   07/15/21 1522 07/15/21 1939 07/16/21 0359 07/16/21 0735  BP: 109/71 112/66 117/67 127/73  Pulse: (!) 58 72 (!) 53 61  Resp: 18 18 18 18   Temp: 98.3 F (36.8 C) 98.1 F (36.7 C) (!) 97.5 F (36.4 C) (!) 97.5 F (36.4 C)  TempSrc: Oral Oral Oral Oral  SpO2: 100% 100% 100% 100%  Weight:      Height:        Intake/Output Summary (Last 24 hours) at 07/16/2021 1132 Last data filed at 07/16/2021 1026 Gross per 24 hour  Intake 900 ml  Output 2100 ml  Net -1200 ml   Physical Exam: A&Ox3, NAD CV: RRR Pulmonary: CTA Bilaterally Abdomen: Soft, Non-tender, Non-distended,              Incision: Intact clean and dry. Right groin:             Incision: Intact clean and dry. Left groin:             Incision: Intact clean and dry. Vascular:             Right lower extremity: Thigh soft.  Calf soft.  Extremities warm distally to toes.  Good capillary refill.  Motor/sensory is intact.  Soft.  Warm.  There is no acute vascular compromise noted on exam at this time.             Left lower extremity: Thigh soft.  Calf soft.  Extremities warm distally to toes.  Good capillary refill.  Motor/sensory is intact.  Soft.  Warm.  There is no acute vascular compromise noted on exam at this time.   Laboratory: CBC    Component Value Date/Time   WBC 9.1 07/16/2021 0458   HGB 11.6 (L) 07/16/2021 0458    HCT 33.1 (L) 07/16/2021 0458   PLT 344 07/16/2021 0458   BMET    Component Value Date/Time   NA 134 (L) 07/16/2021 0458   K 3.9 07/16/2021 0458   CL 105 07/16/2021 0458   CO2 24 07/16/2021 0458   GLUCOSE 93 07/16/2021 0458   BUN 11 07/16/2021 0458   CREATININE 0.81 07/16/2021 0458   CALCIUM 8.4 (L) 07/16/2021 0458   GFRNONAA >60 07/16/2021 0458   Assessment/Planning: The patient is a 60 year old male with aortoiliac disease s/p aorta bifemoral bypass - POD#6   1) No acute issues overnight.  2) Patient is afebrile with stable vital signs and making good urine.   3) On aspirin, statin for medical management. 4) Lovenox for DVT prophylaxis. 5) Okay for the patient to start ambulating out of bed to chair, PT/OT. 6) We will plan on discharge tomorrow.  I spoke with his sister-in-law who will be picking him up from the hospital.  He will be staying with his sister tomorrow upon discharge.   Discussed with Dr. 46 Waunita Sandstrom PA-C 07/16/2021 11:32 AM

## 2021-07-16 NOTE — Progress Notes (Signed)
Physical Therapy Treatment Patient Details Name: Stratton Villwock MRN: [id number] DOB: [birth year] Today's Date: 07/16/2021   History of Present Illness Per vascular. 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  He has had an extensive preoperative evaluation and was felt to be adequate risk for proceeding with aortobifemoral bypass.  He continues to have rest pain in both feet.  He is extremely limited in his walking." 10/5 1. underwent Justarenal aorta to right external iliac and left femoral bypass 2. Left common femoral, profunda femoris, and superficial femoral artery endarterectomies 3. Right external iliac and distal common iliac endarterectomies and removal of previous stent in same location 4. Aortic endarterectomy up to the level of the left renal artery    PT Comments    Pt has made great progress towards his therapy goals. Pt is ambulating 200 ft with RW and SPV for safety. Pt is able to demonstrating seated, supine, and standing exercises to promote LE strengthening and ROM with some cueing for form. Pt reporting less anxiety regarding plan to discharge home tomorrow. Pt reporting he will not have any stairs to complete at his brother's house where he plans to discharge home to. All DME in room at end of session and fit to patient. Will continue to work with patient during acute hospitalization to promote independence with mobility and return to prior level of function.    Recommendations for follow up therapy are one component of a multi-disciplinary discharge planning process, led by the attending physician.  Recommendations may be updated based on patient status, additional functional criteria and insurance authorization.  Follow Up Recommendations  Home health PT;Supervision for mobility/OOB     Equipment Recommendations  Rolling walker with 5" wheels    Recommendations for Other Services       Precautions / Restrictions  Restrictions Weight Bearing Restrictions: Yes LLE Weight Bearing: Weight bearing as tolerated     Mobility  Bed Mobility Overal bed mobility: Modified Independent Bed Mobility: Sit to Supine       Sit to supine: Modified independent (Device/Increase time)        Transfers Overall transfer level: Needs assistance Equipment used: None;Rolling walker (2 wheeled) Transfers: Sit to/from Stand Sit to Stand: Modified independent (Device/Increase time)         General transfer comment: Mod I with device  Ambulation/Gait Ambulation/Gait assistance: Supervision Gait Distance (Feet): 200 Feet Assistive device: Rolling walker (2 wheeled) Gait Pattern/deviations: Step-through pattern;Decreased step length - left;Trunk flexed Gait velocity: decreased   General Gait Details: Cues for decreased UE reliance on device and for upright posture and forward gaze.   Stairs             Wheelchair Mobility    Modified Rankin (Stroke Patients Only)       Balance Overall balance assessment: Needs assistance Sitting-balance support: Feet supported;No upper extremity supported Sitting balance-Leahy Scale: Normal     Standing balance support: No upper extremity supported;During functional activity Standing balance-Leahy Scale: Fair Standing balance comment: Can complete a few steps without device. Able to demonstrate decreased UE assistance on RW but reports he still feels more secure wtih a device                            Cognition Arousal/Alertness: Awake/alert Behavior During Therapy: WFL for tasks assessed/performed Overall Cognitive Status: Within Functional Limits for tasks assessed  Exercises Other Exercises Other Exercises: Seated therex: Marching 2 x 10, LAQ 1 x 10 bilaterally, heel/toe raises 1 x 20. Standing therex with B UE support: hip abduction 1 x 10 bilat, heel raises 1 x 20. Supine therex:  Bridges 1 x 10 with cues for TA activation    General Comments        Pertinent Vitals/Pain Pain Assessment: No/denies pain    Home Living                      Prior Function            PT Goals (current goals can now be found in the care plan section) Acute Rehab PT Goals Patient Stated Goal: have less pain and be more independent PT Goal Formulation: With patient Time For Goal Achievement: 07/25/21 Potential to Achieve Goals: Good Progress towards PT goals: Progressing toward goals    Frequency    Min 2X/week      PT Plan Current plan remains appropriate    Co-evaluation              AM-PAC PT "6 Clicks" Mobility   Outcome Measure  Help needed turning from your back to your side while in a flat bed without using bedrails?: None Help needed moving from lying on your back to sitting on the side of a flat bed without using bedrails?: None Help needed moving to and from a bed to a chair (including a wheelchair)?: None Help needed standing up from a chair using your arms (e.g., wheelchair or bedside chair)?: A Little Help needed to walk in hospital room?: A Little Help needed climbing 3-5 steps with a railing? : A Little 6 Click Score: 21    End of Session Equipment Utilized During Treatment: Gait belt Activity Tolerance: Patient tolerated treatment well Patient left: in bed;with call bell/phone within reach;with bed alarm set Nurse Communication: Mobility status PT Visit Diagnosis: Unsteadiness on feet (R26.81);Muscle weakness (generalized) (M62.81);History of falling (Z91.81)     Time: 0017-4944 PT Time Calculation (min) (ACUTE ONLY): 23 min  Charges:  $Gait Training: 8-22 mins $Therapeutic Exercise: 8-22 mins                     Verl Blalock, SPT    Verl Blalock 07/16/2021, 1:10 PM

## 2021-07-17 ENCOUNTER — Other Ambulatory Visit: Payer: Self-pay

## 2021-07-17 MED ORDER — ASPIRIN EC 81 MG PO TBEC
81.0000 mg | DELAYED_RELEASE_TABLET | Freq: Every day | ORAL | 3 refills | Status: AC
  Filled 2021-07-17: qty 90, 90d supply, fill #0

## 2021-07-17 MED ORDER — OXYCODONE-ACETAMINOPHEN 5-325 MG PO TABS: 1.0000 | ORAL_TABLET | ORAL | 0 refills | Status: DC | PRN

## 2021-07-17 MED ORDER — GABAPENTIN 100 MG PO CAPS
100.0000 mg | ORAL_CAPSULE | Freq: Three times a day (TID) | ORAL | 5 refills | Status: AC
  Filled 2021-07-17: qty 90, 30d supply, fill #0
  Filled 2021-08-27: qty 90, 30d supply, fill #1

## 2021-07-17 MED ORDER — APIXABAN 5 MG PO TABS
5.0000 mg | ORAL_TABLET | Freq: Two times a day (BID) | ORAL | 5 refills | Status: AC
  Filled 2021-07-17: qty 60, 30d supply, fill #0
  Filled 2021-08-19: qty 180, 90d supply, fill #1

## 2021-07-17 NOTE — Discharge Instructions (Signed)
Vascular Surgery Discharge Instructions:  1) you may shower.  Gently clean your incisions with soap and water.  Gently pat dry. 2) please do not engage in strenuous activity or lifting greater than 10 pounds until you are cleared at one of your postoperative follow-ups. 3) please do not drink or drive while taking pain medication.

## 2021-07-17 NOTE — Progress Notes (Signed)
Physical Therapy Treatment Patient Details Name: Eziah Negro MRN: [id number] DOB: 1960-12-14 Today's Date: 07/17/2021   History of Present Illness Per vascular. 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  He has had an extensive preoperative evaluation and was felt to be adequate risk for proceeding with aortobifemoral bypass.  He continues to have rest pain in both feet.  He is extremely limited in his walking." 10/5 1. underwent Justarenal aorta to right external iliac and left femoral bypass 2. Left common femoral, profunda femoris, and superficial femoral artery endarterectomies 3. Right external iliac and distal common iliac endarterectomies and removal of previous stent in same location 4. Aortic endarterectomy up to the level of the left renal artery    PT Comments    Pt has made great progress towards his therapy goals. Pt is currently ambulation 200 ft with usage of RW at mod I level. Pt able to negotiate 7 steps with B UEs on 1 railing and CGA for safety. Pt able to complete some standing exercises without UE assistance + SBA for safety. Pt will continue to benefit from skilled PT to improve functional mobility and promote independence.    Recommendations for follow up therapy are one component of a multi-disciplinary discharge planning process, led by the attending physician.  Recommendations may be updated based on patient status, additional functional criteria and insurance authorization.  Follow Up Recommendations  Home health PT;Supervision for mobility/OOB     Equipment Recommendations  Rolling walker with 5" wheels    Recommendations for Other Services       Precautions / Restrictions Restrictions Weight Bearing Restrictions: Yes LLE Weight Bearing: Weight bearing as tolerated     Mobility  Bed Mobility Overal bed mobility: Independent                  Transfers Overall transfer level: Modified  independent Equipment used: Rolling walker (2 wheeled) Transfers: Sit to/from Stand Sit to Stand: Modified independent (Device/Increase time)         General transfer comment: Mod I with RW  Ambulation/Gait Ambulation/Gait assistance: Supervision Gait Distance (Feet): 200 Feet Assistive device: Rolling walker (2 wheeled) Gait Pattern/deviations: Step-through pattern     General Gait Details: Required two short standing rest breaks due to reports of SOB and fatigue   Stairs Stairs: Yes Stairs assistance: Min guard Stair Management: One rail Right;Step to pattern;Forwards Number of Stairs: 7 General stair comments: Descended stairs with step to gait; Ascending with alternating pattern with B UE assist on R railing. CGA for safety   Wheelchair Mobility    Modified Rankin (Stroke Patients Only)       Balance Overall balance assessment: Needs assistance Sitting-balance support: Feet supported;No upper extremity supported Sitting balance-Leahy Scale: Normal     Standing balance support: No upper extremity supported;During functional activity Standing balance-Leahy Scale: Good Standing balance comment: Able to perform standing marching without UE assistance with mild deficit noted with increased lateral sway. Balance improves with 1 UE assistance during functional activities                            Cognition Arousal/Alertness: Awake/alert Behavior During Therapy: WFL for tasks assessed/performed Overall Cognitive Status: Within Functional Limits for tasks assessed  Exercises Other Exercises Other Exercises: Standing therex: Calf raises 1 x 10 with 1 UE assist, Marching 1 x 10 alternating no UE assist (SBA provided for balance), B hip abduction 1 x 10 each with 1 UE assist    General Comments        Pertinent Vitals/Pain Pain Assessment: 0-10 Pain Score: 1  Pain Location: Abdomen Pain  Descriptors / Indicators: Grimacing;Guarding;Operative site guarding;Sore;Pressure Pain Intervention(s): Limited activity within patient's tolerance;Monitored during session;Repositioned;Utilized relaxation techniques    Home Living                      Prior Function            PT Goals (current goals can now be found in the care plan section) Acute Rehab PT Goals Patient Stated Goal: have less pain and be more independent PT Goal Formulation: With patient Time For Goal Achievement: 07/25/21 Potential to Achieve Goals: Good Progress towards PT goals: Progressing toward goals    Frequency    Min 2X/week      PT Plan Current plan remains appropriate    Co-evaluation              AM-PAC PT "6 Clicks" Mobility   Outcome Measure  Help needed turning from your back to your side while in a flat bed without using bedrails?: None Help needed moving from lying on your back to sitting on the side of a flat bed without using bedrails?: None Help needed moving to and from a bed to a chair (including a wheelchair)?: None Help needed standing up from a chair using your arms (e.g., wheelchair or bedside chair)?: A Little Help needed to walk in hospital room?: A Little Help needed climbing 3-5 steps with a railing? : A Little 6 Click Score: 21    End of Session Equipment Utilized During Treatment: Gait belt Activity Tolerance: Patient tolerated treatment well Patient left: in bed;with call bell/phone within reach;with bed alarm set Nurse Communication: Mobility status PT Visit Diagnosis: Unsteadiness on feet (R26.81);Muscle weakness (generalized) (M62.81);History of falling (Z91.81)     Time: 5366-4403 PT Time Calculation (min) (ACUTE ONLY): 26 min  Charges:  $Gait Training: 8-22 mins $Therapeutic Exercise: 8-22 mins                     Verl Blalock, SPT    Verl Blalock 07/17/2021, 2:05 PM

## 2021-07-17 NOTE — TOC Transition Note (Signed)
Transition of Care [facility]) - CM/SW Discharge Note   Patient Details  Name: Albert [name] MRN: [id number] Date of Birth: 24-Jan-1961  [facility] CM/SW Contact:  Margarito Liner, LCSW Phone Number: 07/17/2021, 12:57 PM   Clinical Narrative:  Patient has orders to discharge home today. [facility] representative is aware. No further concerns. CSW signing off.   Final next level of care: Home w Home Health Services Barriers to Discharge: Barriers Resolved   Patient Goals and CMS Choice Patient states their goals for this hospitalization and ongoing recovery are:: To discharge to brother and sister-in-law home.   Choice offered to / list presented to : NA  Discharge Placement                    Patient and family notified of of transfer: 07/17/21  Discharge Plan and Services     Post Acute Care Choice: NA                    HH Arranged: RN, PT HH Agency: [facility] Date HH Agency Contacted: 07/17/21   Representative spoke with at [facility] Agency: Feliberto Gottron  Social Determinants of Health (SDOH) Interventions     Readmission Risk Interventions No flowsheet data found.

## 2021-07-17 NOTE — Progress Notes (Signed)
Patient ID: Albert [name], male   DOB: 18-Apr-1961, 60 y.o.   MRN: [id number]  An After Visit Summary was printed and given to the patient.  Patient education given on medication changes, follow up appointments and surgical site care and the patient expresses understanding and acceptance of instructions.   Sister to drive home. DME at bedside.  Lidia Collum 07/17/2021 1:39 PM

## 2021-07-17 NOTE — Plan of Care (Signed)
    Patient ID: Albert [name], male   DOB: 12-28-1960, 60 y.o.   MRN: [id number]   Problem: Education: Goal: Knowledge of General Education information will improve Description: Including pain rating scale, medication(s)/side effects and non-pharmacologic comfort measures 07/17/2021 1308 by Lidia Collum, RN Outcome: Adequate for Discharge 07/17/2021 1308 by Lidia Collum, RN Outcome: Progressing   Problem: Health Behavior/Discharge Planning: Goal: Ability to manage health-related needs will improve 07/17/2021 1308 by Lidia Collum, RN Outcome: Adequate for Discharge 07/17/2021 1308 by Lidia Collum, RN Outcome: Progressing   Problem: Clinical Measurements: Goal: Ability to maintain clinical measurements within normal limits will improve 07/17/2021 1308 by Lidia Collum, RN Outcome: Adequate for Discharge 07/17/2021 1308 by Lidia Collum, RN Outcome: Progressing Goal: Will remain free from infection 07/17/2021 1308 by Lidia Collum, RN Outcome: Adequate for Discharge 07/17/2021 1308 by Lidia Collum, RN Outcome: Progressing Goal: Diagnostic test results will improve 07/17/2021 1308 by Lidia Collum, RN Outcome: Adequate for Discharge 07/17/2021 1308 by Lidia Collum, RN Outcome: Progressing Goal: Respiratory complications will improve 07/17/2021 1308 by Lidia Collum, RN Outcome: Adequate for Discharge 07/17/2021 1308 by Lidia Collum, RN Outcome: Progressing Goal: Cardiovascular complication will be avoided 07/17/2021 1308 by Lidia Collum, RN Outcome: Adequate for Discharge 07/17/2021 1308 by Lidia Collum, RN Outcome: Progressing   Problem: Activity: Goal: Risk for activity intolerance will decrease 07/17/2021 1308 by Lidia Collum, RN Outcome: Adequate for Discharge 07/17/2021 1308 by Lidia Collum, RN Outcome: Progressing   Problem: Nutrition: Goal: Adequate nutrition will be  maintained 07/17/2021 1308 by Lidia Collum, RN Outcome: Adequate for Discharge 07/17/2021 1308 by Lidia Collum, RN Outcome: Progressing   Problem: Coping: Goal: Level of anxiety will decrease 07/17/2021 1308 by Lidia Collum, RN Outcome: Adequate for Discharge 07/17/2021 1308 by Lidia Collum, RN Outcome: Progressing   Problem: Elimination: Goal: Will not experience complications related to bowel motility 07/17/2021 1308 by Lidia Collum, RN Outcome: Adequate for Discharge 07/17/2021 1308 by Lidia Collum, RN Outcome: Progressing Goal: Will not experience complications related to urinary retention 07/17/2021 1308 by Lidia Collum, RN Outcome: Adequate for Discharge 07/17/2021 1308 by Lidia Collum, RN Outcome: Progressing   Problem: Pain Managment: Goal: General experience of comfort will improve 07/17/2021 1308 by Lidia Collum, RN Outcome: Adequate for Discharge 07/17/2021 1308 by Lidia Collum, RN Outcome: Progressing   Problem: Safety: Goal: Ability to remain free from injury will improve 07/17/2021 1308 by Lidia Collum, RN Outcome: Adequate for Discharge 07/17/2021 1308 by Lidia Collum, RN Outcome: Progressing   Problem: Skin Integrity: Goal: Risk for impaired skin integrity will decrease 07/17/2021 1308 by Lidia Collum, RN Outcome: Adequate for Discharge 07/17/2021 1308 by Lidia Collum, RN Outcome: Progressing   Problem: Acute Rehab PT Goals(only PT should resolve) Goal: Pt Will Transfer Bed To Chair/Chair To Bed Outcome: Adequate for Discharge Goal: Pt Will Ambulate Outcome: Adequate for Discharge Goal: Pt Will Go Up/Down Stairs Outcome: Adequate for Discharge    Lidia Collum, RN

## 2021-07-17 NOTE — Plan of Care (Signed)

## 2021-07-18 NOTE — Discharge Summary (Addendum)
[facility]    Discharge Summary  Patient ID:  Albert [name] MRN: [id number] DOB/AGE: [birth year] 59 y.o.  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 b.) CT head 04/15/2021 --> remote infarction along the upper margin of the RIGHT putamen common; unchanged.   Lumbar radiculopathy    Marijuana use (daily)    Nephrolithiasis    PVD (peripheral vascular disease) ([name])    Scoliosis    Seizures ([name])    a.) on daily levetiracetam. b.) last seizure 05/2021 (has about 6/year)   SOB (shortness of breath)    Testicular torsion    a.) age 64; required surgical reduction.   Procedure(s): 07/10/21:    1.  Juxtarenal aorta to right external iliac and left femoral bypass with 14 mm diameter proximal 7 mm diameter distal bifurcated Dacron graft 2.   Left common femoral, profunda femoris, and superficial femoral artery endarterectomies 3.   Right external iliac and distal common iliac endarterectomies and removal of previous stent in same location  4.   Aortic endarterectomy up to the level of the  left renal artery  Discharged Condition: Good  HPI / Hospital Course:  Patient presents with rest pain in both legs.  Aortobifemoral/iliac bypass is planned for revascularization for limb salvage.  His aortic disease went up to and actually above the level of the left renal artery which precluded safe endovascular therapy.  The risks and benefits as well as alternative therapies including intervention were reviewed in detail all questions were answered the patient agrees to proceed with surgery. On 07/10/21, the patient underwent:    1.  Juxtarenal aorta to right external iliac and left femoral bypass with 14 mm diameter proximal 7 mm diameter distal bifurcated Dacron graft 2.   Left common femoral, profunda femoris, and superficial femoral artery endarterectomies 3.   Right external iliac and distal common iliac endarterectomies and removal of previous stent in same location  4.   Aortic endarterectomy up to the level of the left renal artery  The patient tolerated the procedure and was transferred from the recovery room to the ICU for observation overnight.  Patient's night of surgery was unremarkable.  During his brief stay, the patient's diet was advanced, his Foley was removed and he was urinating independently, his discomfort was controlled to the use of p.o. pain medication and he was ambulating at baseline.  Patient was evaluated by occupational and physical therapy and deemed safe to be discharged home.  Day of discharge, the patient was afebrile with stable vital signs and making urine.  Day of discharge the patient's  physical exam was stable.  Physical Exam:  A&Ox3, NAD CV: RRR Pulmonary: CTA Bilaterally Abdomen: Soft, Non-tender, Non-distended,              Incision: Intact clean and dry. Right groin:             Incision: Intact clean and dry. Left groin:             Incision: Intact clean and dry. Vascular:             Right lower extremity: Thigh soft.  Calf soft.  Extremities  warm distally to toes.  Good capillary refill.  Motor/sensory is intact.  Soft.  Warm.  There is no acute vascular compromise noted on exam at this time.             Left lower extremity: Thigh soft.  Calf soft.  Extremities warm distally to toes.  Good capillary refill.  Motor/sensory is intact.  Soft.  Warm.  There is no acute vascular compromise noted on exam at this time.  Labs: As below  Complications: None  Consults:  Physical therapy Occupational Therapy  Significant Diagnostic Studies: CBC Lab Results  Component Value Date   WBC 9.1 07/16/2021   HGB 11.6 (L) 07/16/2021   HCT 33.1 (L) 07/16/2021   MCV 90.2 07/16/2021   PLT 344 07/16/2021   BMET    Component Value Date/Time   NA 134 (L) 07/16/2021 0458   K 3.9 07/16/2021 0458   CL 105 07/16/2021 0458   CO2 24 07/16/2021 0458   GLUCOSE 93 07/16/2021 0458   BUN 11 07/16/2021 0458   CREATININE 0.81 07/16/2021 0458   CALCIUM 8.4 (L) 07/16/2021 0458   GFRNONAA >60 07/16/2021 0458   COAG Lab Results  Component Value Date   INR 0.9 03/06/2021   Disposition:  Discharge to :Home  Allergies as of 07/17/2021   No Known Allergies      Medication List     STOP taking these medications    Melatonin 10 MG Caps       TAKE these medications    acetaminophen 500 MG tablet Commonly known as: TYLENOL Take 500 mg by mouth every 6 (six) hours as needed.   amLODipine 5 MG tablet Commonly known as: NORVASC Take 1 tablet (5 mg total) by mouth daily.   Aspirin Adult Low Strength 81 MG EC tablet Generic drug: aspirin Take 1 tablet (81 mg total) by mouth once daily. Swallow whole.   atorvastatin 80 MG tablet Commonly known as: LIPITOR Take 1 tablet (80 mg total) by mouth once daily. What changed: when to take this   Eliquis 5 MG Tabs tablet Generic drug: apixaban Take 1 tablet (5 mg total) by mouth 2 (two) times daily.   gabapentin 100 MG capsule Commonly known as: NEURONTIN Take 1 capsule (100 mg total) by  mouth 3 (three) times daily.   levETIRAcetam 250 MG tablet Commonly known as: KEPPRA Take 3 tablets (750 mg total) by mouth 2 (two) times daily.   oxyCODONE-acetaminophen 5-325 MG tablet Commonly known as: PERCOCET/ROXICET Place 1-2 tablets into feeding tube every 4 (four) hours as needed for moderate pain.       Verbal and written Discharge instructions given to the patient. Wound care per Discharge AVS  Follow-up Information     Georgiana Spinner, NP Follow up in 2 week(s).   Specialty: Vascular Surgery Why: First post-op check. Incision check. No studies. Contact information: [address] [place] [address] [phone]  [facility] Follow up.   Specialty: Home Health Services Why: They will follow up with you for your home health needs.               SignedTonette Lederer, PA-C 07/18/2021, 11:32 AM

## 2021-07-23 ENCOUNTER — Telehealth (INDEPENDENT_AMBULATORY_CARE_PROVIDER_SITE_OTHER): Payer: Self-pay

## 2021-07-23 NOTE — Telephone Encounter (Signed)
I called Amy pope an made her aware that the verbal order has been approved.

## 2021-07-23 NOTE — Telephone Encounter (Signed)
Albert [name] from [facility] called and left a VM on the nurses line wanting to know could she have a verbal order for HC 1 x a week for 1 week then 1 x 1,2 x 7, 1 x 7. Please advise.

## 2021-07-23 NOTE — Telephone Encounter (Signed)
That's fine

## 2021-07-30 ENCOUNTER — Ambulatory Visit (INDEPENDENT_AMBULATORY_CARE_PROVIDER_SITE_OTHER): Payer: Medicaid Other | Admitting: Nurse Practitioner

## 2021-07-30 ENCOUNTER — Encounter (INDEPENDENT_AMBULATORY_CARE_PROVIDER_SITE_OTHER): Payer: Self-pay | Admitting: Nurse Practitioner

## 2021-07-30 ENCOUNTER — Other Ambulatory Visit: Payer: Self-pay

## 2021-07-30 VITALS — BP 128/72 | HR 80 | Resp 16 | Wt 105.8 lb

## 2021-07-30 DIAGNOSIS — I70229 Atherosclerosis of native arteries of extremities with rest pain, unspecified extremity: Secondary | ICD-10-CM

## 2021-07-30 DIAGNOSIS — I10 Essential (primary) hypertension: Secondary | ICD-10-CM

## 2021-07-30 DIAGNOSIS — F17200 Nicotine dependence, unspecified, uncomplicated: Secondary | ICD-10-CM

## 2021-07-30 DIAGNOSIS — S91109A Unspecified open wound of unspecified toe(s) without damage to nail, initial encounter: Secondary | ICD-10-CM

## 2021-07-30 MED ORDER — GABAPENTIN 300 MG PO CAPS: 300.0000 mg | ORAL_CAPSULE | Freq: Every day | ORAL | 0 refills | Status: AC

## 2021-08-01 ENCOUNTER — Ambulatory Visit: Payer: Medicaid Other | Admitting: Podiatry

## 2021-08-01 ENCOUNTER — Other Ambulatory Visit: Payer: Self-pay

## 2021-08-01 DIAGNOSIS — L03032 Cellulitis of left toe: Secondary | ICD-10-CM | POA: Diagnosis not present

## 2021-08-01 MED ORDER — DOXYCYCLINE HYCLATE 100 MG PO TABS
100.0000 mg | ORAL_TABLET | Freq: Two times a day (BID) | ORAL | 0 refills | Status: AC
  Filled 2021-08-01: qty 20, 10d supply, fill #0

## 2021-08-05 ENCOUNTER — Encounter (INDEPENDENT_AMBULATORY_CARE_PROVIDER_SITE_OTHER): Payer: Self-pay | Admitting: Nurse Practitioner

## 2021-08-05 NOTE — Progress Notes (Signed)
Subjective:    Patient ID: Albert [name], male    DOB: [birth year], 60 y.o.   MRN: [id number] Chief Complaint  Patient presents with   Routine Post Op    [facility] 2wk post op incision check    Albert [name] is a 60 year old male that presents today for wound evaluation and staple removal following intervention on 07/10/2021 including:  PROCEDURE: 1. Juxtarenal aorto to right external iliac artery and the left common femoral artery bypass grafting with 14 x 7 bifurcated dacryon graft 2.   Right common iliac and external iliac artery endarterectomy 3.   Left common femoral superficial femoral and profunda femoris endarterectomy 4.   Pararenal aortic endarterectomy    The wounds look well and nearly healed.  He has some numbness and tingling of the feet but they are warm with good pulses.  Overall he is progressing well.   Review of Systems  Cardiovascular:  Positive for leg swelling.  Skin:  Positive for wound.  All other systems reviewed and are negative.     Objective:   Physical Exam Vitals reviewed.  HENT:     Head: Normocephalic.  Cardiovascular:     Rate and Rhythm: Normal rate.     Pulses: Normal pulses.  Pulmonary:     Effort: Pulmonary effort is normal.  Skin:    General: Skin is warm and dry.  Neurological:     Mental Status: He is alert and oriented to person, place, and time.  Psychiatric:        Mood and Affect: Mood normal.        Behavior: Behavior normal.        Thought Content: Thought content normal.        Judgment: Judgment normal.    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 b.) CT head 04/15/2021 --> remote infarction along the upper margin of the RIGHT putamen common; unchanged.   Lumbar radiculopathy    Marijuana use (daily)    Nephrolithiasis    PVD (peripheral vascular disease) ([name])    Scoliosis    Seizures ([place])    a.) on daily levetiracetam. b.) last seizure 05/2021 (has about 6/year)   SOB (shortness of breath)    Testicular torsion    a.) age 76; required surgical reduction.    Social History   Socioeconomic History   Marital status: Legally Separated    Spouse name: Not on file   Number of children: Not on file   Years of education: Not on file   Highest education level: Not on file  Occupational History   Not on file  Tobacco Use   Smoking status: Former    Types: Cigarettes   Smokeless tobacco: Never  Vaping Use   Vaping Use: Former  Substance and Sexual Activity   Alcohol use: Yes    Comment: daily   Drug use: Yes    Types: Marijuana    Comment: daily   Sexual activity: Not on file  Other Topics Concern   Not on file  Social History Narrative   Not on file   Social Determinants of Health   Financial Resource Strain: Not on file  Food Insecurity: Not on file  Transportation Needs: Not on file  Physical Activity: Not on file  Stress: Not on file  Social Connections: Not on file  Intimate Partner Violence: Not on file    Past Surgical History:  Procedure Laterality Date   ABDOMINAL AORTOGRAM W/LOWER EXTREMITY N/A 03/08/2021   Procedure: ABDOMINAL AORTOGRAM W/LOWER EXTREMITY;  Surgeon: Annice Needy, MD;  Location: [facility] INVASIVE CV LAB;  Service: Cardiovascular;  Laterality: N/A;   AORTA - BILATERAL FEMORAL ARTERY BYPASS GRAFT Bilateral 07/10/2021    Procedure: Aortic Endarterectomy; Right Iliac Endarterectomy; Left Common Femoral, Profundo-femoral and Superficial Endarterectomy; Aorta to Right External Iliac Bypass and Left Fermoral Bypass;  Surgeon: Annice Needy, MD;  Location: [facility] ORS;  Service: Vascular;  Laterality: Bilateral;   CENTRAL VENOUS CATHETER INSERTION Right 07/10/2021   Procedure: INSERTION CENTRAL LINE ADULT;  Surgeon: Annice Needy, MD;  Location: [facility] ORS;  Service: Vascular;  Laterality: Right;   TESTICLE TORSION REDUCTION     age 86    Family History  Problem Relation Age of Onset   Hypertension Father     No Known Allergies  CBC Latest Ref Rng & Units 07/16/2021 07/13/2021 07/12/2021  WBC 4.0 - 10.5 K/uL 9.1 13.3(H) 20.4(H)  Hemoglobin 13.0 - 17.0 g/dL 11.6(L) 12.1(L) 12.9(L)  Hematocrit 39.0 - 52.0 % 33.1(L) 36.7(L) 37.0(L)  Platelets 150 - 400 K/uL 344 238 240      CMP     Component Value Date/Time   NA 134 (L) 07/16/2021 0458   K 3.9 07/16/2021 0458   CL 105 07/16/2021 0458   CO2 24 07/16/2021 0458   GLUCOSE 93 07/16/2021 0458   BUN 11 07/16/2021 0458   CREATININE 0.81 07/16/2021 0458   CALCIUM 8.4 (L) 07/16/2021 0458   PROT 6.7 03/06/2021 1619   ALBUMIN 3.3 (L) 03/06/2021 1619   AST 27 03/06/2021 1619   ALT 10 03/06/2021 1619   ALKPHOS 119 03/06/2021 1619   BILITOT 0.8 03/06/2021 1619   GFRNONAA >60 07/16/2021 0458     No results found.     Assessment & Plan:   1. Open wound of toe, initial encounter The patient has a small superficial wound on his foot.  The looks like it may be caused by some overgrown toenails in the area.  We will send to podiatry for wound evaluation as well as possible toenail clipping. - Ambulatory referral to Podiatry  2. Primary hypertension Continue antihypertensive medications as already ordered, these medications have been reviewed and there are no changes at this time.   3. Atherosclerosis of artery of extremity with rest pain ([name]) The patient has good  palpable pulses with warm feet.  We will have him return to the office in approximately 2 weeks for noninvasive studies.  4. Tobacco dependence Smoking cessation was discussed, 3-10 minutes spent on this topic specifically    Current Outpatient Medications on File Prior to Visit  Medication Sig Dispense Refill   acetaminophen (TYLENOL) 500 MG tablet Take 500 mg by mouth every 6 (six) hours  as needed.     amLODipine (NORVASC) 5 MG tablet Take 1 tablet (5 mg total) by mouth daily. 30 tablet 3   apixaban (ELIQUIS) 5 MG TABS tablet Take 1 tablet (5 mg total) by mouth 2 (two) times daily. 60 tablet 5   aspirin EC 81 MG tablet Take 1 tablet (81 mg total) by mouth once daily. Swallow whole. 90 tablet 3   atorvastatin (LIPITOR) 80 MG tablet Take 1 tablet (80 mg total) by mouth once daily. (Patient taking differently: Take 80 mg by mouth daily with lunch.) 30 tablet 3   gabapentin (NEURONTIN) 100 MG capsule Take 1 capsule (100 mg total) by mouth 3 (three) times daily. 90 capsule 5   levETIRAcetam (KEPPRA) 250 MG tablet Take 3 tablets (750 mg total) by mouth 2 (two) times daily. 180 tablet 2   oxyCODONE-acetaminophen (PERCOCET/ROXICET) 5-325 MG tablet Place 1-2 tablets into feeding tube every 4 (four) hours as needed for moderate pain. 50 tablet 0   No current facility-administered medications on file prior to visit.    There are no Patient Instructions on file for this visit. No follow-ups on file.   Georgiana Spinner, NP

## 2021-08-06 NOTE — Progress Notes (Signed)
Pain  Subjective:  Patient ID: Albert [name], male    DOB: Mar 20, 1961,  MRN: [id number]  Chief Complaint  Patient presents with   Nail Problem    Nail trim     60 y.o. male presents with the above complaint.  Patient presents with complaint of redness around the second digit.  Patient states is mildly painful there are some drainage coming out of it he wanted to get it evaluated.  He has not seen anyone else prior to seeing me.  He would like to discuss treatment options for this.  He does not have any ingrown.  He does not take any antibiotics.  He is ambulating in regular shoes review of Systems: Negative except as noted in the HPI. Denies N/V/F/Ch.  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   Chronic anticoagulation    a.) ASA + apixaban   DDD (degenerative disc disease), lumbar    Depression    Diverticulosis    Dyshidrotic eczema    HLD (hyperlipidemia)    Hypertension    Lacunar infarction [facility]([name])    a.) MRI 05/18/2020 --> Old small vessell infarction in the RIGHT basal ganglia to corona radiography region. b.) CT head 04/15/2021 --> remote infarction along the upper margin of the RIGHT putamen common; unchanged.   Lumbar radiculopathy    Marijuana use (daily)    Nephrolithiasis    PVD (peripheral vascular disease) ([name])    Scoliosis    Seizures ([name])    a.) on daily levetiracetam. b.) last seizure 05/2021 (has about 6/year)   SOB (shortness of breath)    Testicular torsion    a.) age 60; required surgical reduction.    Current Outpatient Medications:    doxycycline (VIBRA-TABS) 100 MG tablet, Take 1 tablet (100 mg total) by mouth 2 (two) times daily for 10 days., Disp: 20 tablet, Rfl: 0   acetaminophen (TYLENOL) 500 MG tablet, Take 500 mg by mouth every 6 (six) hours as  needed., Disp: , Rfl:    amLODipine (NORVASC) 5 MG tablet, Take 1 tablet (5 mg total) by mouth daily., Disp: 30 tablet, Rfl: 3   apixaban (ELIQUIS) 5 MG TABS tablet, Take 1 tablet (5 mg total) by mouth 2 (two) times daily., Disp: 60 tablet, Rfl: 5   aspirin EC 81 MG tablet, Take 1 tablet (81 mg total) by mouth once daily. Swallow whole., Disp: 90 tablet, Rfl: 3   atorvastatin (LIPITOR) 80 MG tablet, Take 1 tablet (80 mg total) by mouth once daily. (Patient taking differently: Take 80 mg by mouth daily with lunch.), Disp: 30 tablet, Rfl: 3   gabapentin (NEURONTIN) 100 MG capsule, Take 1 capsule (100 mg total) by mouth 3 (three) times daily., Disp: 90 capsule, Rfl: 5   gabapentin (NEURONTIN) 300 MG capsule, Take 1 capsule (300 mg total) by mouth daily., Disp: 30 capsule, Rfl: 0   levETIRAcetam (KEPPRA) 250 MG tablet, Take 3 tablets (750 mg total) by mouth 2 (two) times daily., Disp: 180 tablet, Rfl: 2   oxyCODONE-acetaminophen (PERCOCET/ROXICET) 5-325 MG tablet, Place 1-2 tablets into feeding tube every 4 (four) hours as needed for moderate pain., Disp: 50 tablet, Rfl: 0  Social History   Tobacco Use  Smoking Status Former   Types: Cigarettes  Smokeless Tobacco Never  No Known Allergies Objective:  There were no vitals filed for this visit. There is no height or weight on file to calculate BMI. Constitutional Well developed. Well nourished.  Vascular Dorsalis pedis pulses palpable bilaterally. Posterior tibial pulses palpable bilaterally. Capillary refill normal to all digits.  No cyanosis or clubbing noted. Pedal hair growth normal.  Neurologic Normal speech. Oriented to person, place, and time. Epicritic sensation to light touch grossly present bilaterally.  Dermatologic Left second digit mild paronychia with redness.  No purulent drainage noted no malodor present.  No ingrowing noted.  Orthopedic: Normal joint ROM without pain or crepitus bilaterally. No visible deformities. No  bony tenderness.   Radiographs: None Assessment:   1. Paronychia of toe, left    Plan:  Patient was evaluated and treated and all questions answered.  Left second digit paronychia without ingrown mild the proximal nail fold -Asked for the patient the etiology of paronychia versus treatment options were discussed.  He may have had a mild contusion that he cannot recall the lateral proximal nail fold paronychia.  At this time given the amount of pain that is happening without any setting of purulent drainage I believe he would benefit from antibiotics.  Doxycycline was sent to the pharmacy.  I encouraged him to complete the course. -I will see him back in few weeks to make sure that the redness has not gotten worse I have discussed with him if he gets worse go to the emergency room right away he states understanding. -Doxycycline was sent to the pharmacy  No follow-ups on file.

## 2021-08-07 ENCOUNTER — Other Ambulatory Visit (INDEPENDENT_AMBULATORY_CARE_PROVIDER_SITE_OTHER): Payer: Self-pay | Admitting: Nurse Practitioner

## 2021-08-07 ENCOUNTER — Telehealth (INDEPENDENT_AMBULATORY_CARE_PROVIDER_SITE_OTHER): Payer: Self-pay

## 2021-08-07 MED ORDER — OXYCODONE-ACETAMINOPHEN 5-325 MG PO TABS: 1.0000 | ORAL_TABLET | ORAL | 0 refills | Status: AC | PRN

## 2021-08-07 MED ORDER — OXYCODONE-ACETAMINOPHEN 5-325 MG PO TABS: 1.0000 | ORAL_TABLET | ORAL | 0 refills | Status: DC | PRN

## 2021-08-07 NOTE — Telephone Encounter (Signed)
sent 

## 2021-08-07 NOTE — Telephone Encounter (Signed)
Pt's home care nurse called and left a VM on the nurses line wanting to make [place] aware that the pt want a refill on is oxycodone. Please advise.

## 2021-08-09 ENCOUNTER — Encounter (INDEPENDENT_AMBULATORY_CARE_PROVIDER_SITE_OTHER): Payer: Medicaid Other

## 2021-08-09 ENCOUNTER — Ambulatory Visit (INDEPENDENT_AMBULATORY_CARE_PROVIDER_SITE_OTHER): Payer: Medicaid Other | Admitting: Nurse Practitioner

## 2021-08-12 ENCOUNTER — Other Ambulatory Visit (INDEPENDENT_AMBULATORY_CARE_PROVIDER_SITE_OTHER): Payer: Self-pay | Admitting: Nurse Practitioner

## 2021-08-12 DIAGNOSIS — I739 Peripheral vascular disease, unspecified: Secondary | ICD-10-CM

## 2021-08-12 DIAGNOSIS — Z95820 Peripheral vascular angioplasty status with implants and grafts: Secondary | ICD-10-CM

## 2021-08-13 ENCOUNTER — Other Ambulatory Visit: Payer: Self-pay

## 2021-08-14 ENCOUNTER — Ambulatory Visit (INDEPENDENT_AMBULATORY_CARE_PROVIDER_SITE_OTHER): Payer: Medicaid Other

## 2021-08-14 ENCOUNTER — Ambulatory Visit (INDEPENDENT_AMBULATORY_CARE_PROVIDER_SITE_OTHER): Payer: Medicaid Other | Admitting: Nurse Practitioner

## 2021-08-14 ENCOUNTER — Encounter (INDEPENDENT_AMBULATORY_CARE_PROVIDER_SITE_OTHER): Payer: Medicaid Other

## 2021-08-14 ENCOUNTER — Encounter (INDEPENDENT_AMBULATORY_CARE_PROVIDER_SITE_OTHER): Payer: Self-pay | Admitting: Nurse Practitioner

## 2021-08-14 VITALS — BP 115/72 | HR 61 | Ht 67.0 in | Wt 106.0 lb

## 2021-08-14 DIAGNOSIS — Z95820 Peripheral vascular angioplasty status with implants and grafts: Secondary | ICD-10-CM | POA: Diagnosis not present

## 2021-08-14 DIAGNOSIS — I10 Essential (primary) hypertension: Secondary | ICD-10-CM

## 2021-08-14 DIAGNOSIS — I739 Peripheral vascular disease, unspecified: Secondary | ICD-10-CM | POA: Diagnosis not present

## 2021-08-14 DIAGNOSIS — I70229 Atherosclerosis of native arteries of extremities with rest pain, unspecified extremity: Secondary | ICD-10-CM

## 2021-08-15 ENCOUNTER — Ambulatory Visit: Payer: Medicaid Other | Admitting: Podiatry

## 2021-08-19 ENCOUNTER — Other Ambulatory Visit: Payer: Self-pay

## 2021-08-19 ENCOUNTER — Encounter (INDEPENDENT_AMBULATORY_CARE_PROVIDER_SITE_OTHER): Payer: Self-pay | Admitting: Nurse Practitioner

## 2021-08-19 NOTE — Progress Notes (Signed)
Subjective:    Patient ID: Albert [name], male    DOB: [birth year], 60 y.o.   MRN: [id number] Chief Complaint  Patient presents with  . Follow-up    2 wk [place]    Albert [name] is a 60 year old male that returns today following intervention on 09/29/2021.  Previously all the staples were removed from his wound.  Today his wounds are completely healed.  He still has some soreness associated.  He denies any fever or chills.  Overall he is doing well.  His right ABI is 1.04 with a left of 1.20.  The patient has 2 small heterogeneous palpable areas both no flow or possibly hematomas within his abdomen.  The patient's graft is open and patent without any evidence of significant stenosis.  The patient has biphasic/monophasic waveforms throughout.  Additional toe waveform imaging reveals mostly normal toe waveforms bilaterally.   Review of Systems  Cardiovascular:  Positive for leg swelling.  All other systems reviewed and are negative.     Objective:   Physical Exam Vitals reviewed.  HENT:     Head: Normocephalic.  Cardiovascular:     Rate and Rhythm: Normal rate.     Pulses:          Dorsalis pedis pulses are 1+ on the right side and 1+ on the left side.       Posterior tibial pulses are 1+ on the right side and 1+ on the left side.  Pulmonary:     Effort: Pulmonary effort is normal.  Musculoskeletal:     Right lower leg: Edema present.  Skin:    General: Skin is warm and dry.  Neurological:     Mental Status: He is alert and oriented to person, place, and time.  Psychiatric:        Mood and Affect: Mood normal.        Behavior: Behavior normal.        Thought Content: Thought content normal.        Judgment: Judgment normal.    BP 115/72   Pulse 61   Ht 5\' 7"  (1.702 m)   Wt 106 lb (48.1 kg)   BMI 16.60 kg/m   Past Medical History:  Diagnosis Date  . Angina at rest [facility])   . Anxiety   . 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  . Chronic anticoagulation    a.) ASA + apixaban  . DDD (degenerative disc disease), lumbar   . Depression   . Diverticulosis   . Dyshidrotic eczema   . HLD (hyperlipidemia)   . Hypertension   . 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 b.) CT head 04/15/2021 --> remote infarction along the upper margin of the RIGHT putamen common; unchanged.  . Lumbar radiculopathy   . Marijuana use (daily)   . Nephrolithiasis   . PVD (peripheral vascular disease) ([place])   . Scoliosis   . Seizures ([place])    a.) on daily levetiracetam. b.) last seizure 05/2021 (has about 6/year)  . SOB (shortness of breath)   . Testicular torsion    a.) age 43; required surgical reduction.    Social History   Socioeconomic History  . Marital status: Legally Separated    Spouse name: Not on file  . Number of children: Not  on file  . Years of education: Not on file  . Highest education level: Not on file  Occupational History  . Not on file  Tobacco Use  . Smoking status: Former    Types: Cigarettes  . Smokeless tobacco: Never  Vaping Use  . Vaping Use: Former  Substance and Sexual Activity  . Alcohol use: Yes    Comment: daily  . Drug use: Yes    Types: Marijuana    Comment: daily  . Sexual activity: Not on file  Other Topics Concern  . Not on file  Social History Narrative  . Not on file   Social Determinants of Health   Financial Resource Strain: Not on file  Food Insecurity: Not on file  Transportation Needs: Not on file  Physical Activity: Not on file  Stress: Not on file  Social Connections: Not on file  Intimate Partner Violence: Not on file    Past Surgical History:  Procedure Laterality Date  . ABDOMINAL AORTOGRAM W/LOWER EXTREMITY N/A 03/08/2021   Procedure: ABDOMINAL AORTOGRAM W/LOWER  EXTREMITY;  Surgeon: Algernon Huxley, MD;  Location: [facility];  Service: Cardiovascular;  Laterality: N/A;  . AORTA - BILATERAL FEMORAL ARTERY BYPASS GRAFT Bilateral 07/10/2021   Procedure: Aortic Endarterectomy; Right Iliac Endarterectomy; Left Common Femoral, Profundo-femoral and Superficial Endarterectomy; Aorta to Right External Iliac Bypass and Left Fermoral Bypass;  Surgeon: Algernon Huxley, MD;  Location: [facility];  Service: Vascular;  Laterality: Bilateral;  . CENTRAL VENOUS CATHETER INSERTION Right 07/10/2021   Procedure: INSERTION CENTRAL LINE ADULT;  Surgeon: Algernon Huxley, MD;  Location: [facility];  Service: Vascular;  Laterality: Right;  . TESTICLE TORSION REDUCTION     age 3    Family History  Problem Relation Age of Onset  . Hypertension Father     No Known Allergies  CBC Latest Ref Rng & Units 07/16/2021 07/13/2021 07/12/2021  WBC 4.0 - 10.5 K/uL 9.1 13.3(H) 20.4(H)  Hemoglobin 13.0 - 17.0 g/dL 11.6(L) 12.1(L) 12.9(L)  Hematocrit 39.0 - 52.0 % 33.1(L) 36.7(L) 37.0(L)  Platelets 150 - 400 K/uL 344 238 240      CMP     Component Value Date/Time   NA 134 (L) 07/16/2021 0458   K 3.9 07/16/2021 0458   CL 105 07/16/2021 0458   CO2 24 07/16/2021 0458   GLUCOSE 93 07/16/2021 0458   BUN 11 07/16/2021 0458   CREATININE 0.81 07/16/2021 0458   CALCIUM 8.4 (L) 07/16/2021 0458   PROT 6.7 03/06/2021 1619   ALBUMIN 3.3 (L) 03/06/2021 1619   AST 27 03/06/2021 1619   ALT 10 03/06/2021 1619   ALKPHOS 119 03/06/2021 1619   BILITOT 0.8 03/06/2021 1619   GFRNONAA >60 07/16/2021 0458     VAS [place] ABI WITH/WO TBI  Result Date: 08/16/2021  LOWER EXTREMITY DOPPLER STUDY Patient Name:  Albert [name]  Date of Exam:   08/14/2021 Medical Rec #: [id number]         Accession #:    [id number] Date of Birth: 03-20-1961         Patient Gender: M Patient Age:   31 years Exam Location:  [facility] Procedure:      VAS [place] ABI WITH/WO TBI Referring Phys: Eulogio Ditch  --------------------------------------------------------------------------------  Indications: Peripheral artery disease.  Vascular Interventions: 03/08/21: Right CIA/EIA stent;                         07/10/21: Juxtarenal  Aorta to Right CIA/EIA & Left CFA                         Bypass Graft; Right CIA/EIA endarterectomy with stent                         removal; Left CFA/PFA/SFA endarterectomy;. Performing Technologist: Blondell Reveal RT, RDMS, RVT  Examination Guidelines: A complete evaluation includes at minimum, Doppler waveform signals and systolic blood pressure reading at the level of bilateral brachial, anterior tibial, and posterior tibial arteries, when vessel segments are accessible. Bilateral testing is considered an integral part of a complete examination. Photoelectric Plethysmograph (PPG) waveforms and toe systolic pressure readings are included as required and additional duplex testing as needed. Limited examinations for reoccurring indications may be performed as noted.  ABI Findings: +---------+------------------+-----+--------+--------+ Right    Rt Pressure (mmHg)IndexWaveformComment  +---------+------------------+-----+--------+--------+ Brachial 113                                     +---------+------------------+-----+--------+--------+ ATA      116               1.03 biphasic         +---------+------------------+-----+--------+--------+ PTA      118               1.04 biphasic         +---------+------------------+-----+--------+--------+ Great Toe94                0.83 Normal           +---------+------------------+-----+--------+--------+ +---------+------------------+-----+---------------------+-------+ Left     Lt Pressure (mmHg)IndexWaveform             Comment +---------+------------------+-----+---------------------+-------+ Brachial 108                                                  +---------+------------------+-----+---------------------+-------+ ATA      128               1.13 biphasic                     +---------+------------------+-----+---------------------+-------+ PTA      136               1.20 monophasic/retrograde        +---------+------------------+-----+---------------------+-------+ PERO     131               1.16 biphasic                     +---------+------------------+-----+---------------------+-------+ Great Toe97                0.86 Normal                       +---------+------------------+-----+---------------------+-------+ TOES Findings: +----------+---------------+--------+-------+ Right ToesPressure (mmHg)WaveformComment +----------+---------------+--------+-------+ 1st Digit                Normal          +----------+---------------+--------+-------+ 2nd Digit                Abnormal        +----------+---------------+--------+-------+ 3rd Digit  Normal          +----------+---------------+--------+-------+ 4th Digit                Abnormal        +----------+---------------+--------+-------+ 5th Digit                Abnormal        +----------+---------------+--------+-------+  +---------+---------------+--------+-------+ Left ToesPressure (mmHg)WaveformComment +---------+---------------+--------+-------+ 1st Digit               Normal          +---------+---------------+--------+-------+ 2nd Digit               Normal          +---------+---------------+--------+-------+ 3rd Digit               Normal          +---------+---------------+--------+-------+ 4th Digit               Normal          +---------+---------------+--------+-------+ 5th Digit               Normal          +---------+---------------+--------+-------+  Summary: Right: Resting right ankle-brachial index is within normal range. No evidence of significant right lower extremity arterial disease. The  right toe-brachial index is normal. Abnormal PPG waveforms in 2nd, 4th and 5th digits. Left: Resting left ankle-brachial index is within normal range. No evidence of significant left lower extremity arterial disease. The left toe-brachial index is normal. Normal PPG waveforms throughout digits.  *See table(s) above for measurements and observations.  Electronically signed by Festus Barren MD on 08/16/2021 at 9:48:48 AM.    Final        Assessment & Plan:   1. Atherosclerosis of artery of extremity with rest pain ([name])  Recommend:  The patient has evidence of atherosclerosis of the lower extremities with claudication.  The patient does not voice lifestyle limiting changes at this point in time.  Patient incisions are well-healed.  No invasive studies, angiography or surgery at this time The patient should continue walking and begin a more formal exercise program.  The patient should continue antiplatelet therapy and aggressive treatment of the lipid abnormalities  No changes in the patient's medications at this time  The patient should continue wearing graduated compression socks 10-15 mmHg strength to control the mild edema.    2. Primary hypertension Continue antihypertensive medications as already ordered, these medications have been reviewed and there are no changes at this time.    Current Outpatient Medications on File Prior to Visit  Medication Sig Dispense Refill  . acetaminophen (TYLENOL) 500 MG tablet Take 500 mg by mouth every 6 (six) hours as needed.    Marland Kitchen amLODipine (NORVASC) 5 MG tablet Take 1 tablet (5 mg total) by mouth daily. 30 tablet 3  . apixaban (ELIQUIS) 5 MG TABS tablet Take 1 tablet (5 mg total) by mouth 2 (two) times daily. 60 tablet 5  . aspirin EC 81 MG tablet Take 1 tablet (81 mg total) by mouth once daily. Swallow whole. 90 tablet 3  . atorvastatin (LIPITOR) 80 MG tablet Take 1 tablet (80 mg total) by mouth once daily. (Patient taking differently: Take 80 mg by  mouth daily with lunch.) 30 tablet 3  . gabapentin (NEURONTIN) 100 MG capsule Take 1 capsule (100 mg total) by mouth 3 (three) times daily. 90 capsule 5  . gabapentin (NEURONTIN) 300 MG capsule Take 1  capsule (300 mg total) by mouth daily. 30 capsule 0  . levETIRAcetam (KEPPRA) 250 MG tablet Take 3 tablets (750 mg total) by mouth 2 (two) times daily. 180 tablet 2  . oxyCODONE-acetaminophen (PERCOCET/ROXICET) 5-325 MG tablet Place 1-2 tablets into feeding tube every 4 (four) hours as needed for moderate pain. 50 tablet 0   No current facility-administered medications on file prior to visit.    There are no Patient Instructions on file for this visit. No follow-ups on file.   Kris Hartmann, NP

## 2021-08-20 ENCOUNTER — Ambulatory Visit: Payer: Self-pay | Admitting: Podiatry

## 2021-08-23 ENCOUNTER — Other Ambulatory Visit: Payer: Self-pay

## 2021-08-23 MED ORDER — ATORVASTATIN CALCIUM 80 MG PO TABS
ORAL_TABLET | ORAL | 1 refills | Status: AC
  Filled 2021-08-23: qty 90, 90d supply, fill #0

## 2021-08-27 ENCOUNTER — Other Ambulatory Visit: Payer: Self-pay

## 2021-08-27 MED ORDER — LEVETIRACETAM 250 MG PO TABS
ORAL_TABLET | ORAL | 0 refills | Status: DC
  Filled 2021-08-27: qty 180, 30d supply, fill #0

## 2021-08-28 ENCOUNTER — Other Ambulatory Visit: Payer: Self-pay

## 2021-09-02 ENCOUNTER — Other Ambulatory Visit: Payer: Self-pay

## 2021-09-05 ENCOUNTER — Telehealth: Payer: Self-pay | Admitting: Pharmacy Technician

## 2021-09-05 NOTE — Telephone Encounter (Signed)
Patient has Medicaid with prescription drug coverage.  No longer meets [facility]'s eligibility criteria.  Pt. notified. [name] Care Manager [facility]   [name] [place], [place]  [address]   09/04/21    Albert [name] [address] [place], [place]  [address]  Dear Albert Gens:  This is to inform you that you are no longer eligible to receive medication assistance at [facility].  The reason(s) are:    _____Your total gross monthly household income exceeds 250% of the Federal Poverty Level.   _____Tangible assets (savings, checking, stocks/bonds, pension, retirement, etc.) exceeds our limit  _____You are eligible to receive benefits from a Medicare Part "D" plan __X__You have prescription insurance with Medicaid _____You are not an [facility] resident _____Failure to provide all requested documentation (proof of income information for 2022., and/or Patient Intake Application, DOH Attestation, Contract, etc).    We regret that we are unable to help you at this time.  If your prescription coverage is terminated, please contact [facility], so that we may reassess your eligibility for our program.  If you have questions, we may be contacted at [phone].  Thank you,  [facility]

## 2021-09-09 ENCOUNTER — Other Ambulatory Visit: Payer: Self-pay | Admitting: Pharmacist

## 2021-09-10 ENCOUNTER — Other Ambulatory Visit: Payer: Self-pay

## 2021-09-13 ENCOUNTER — Other Ambulatory Visit: Payer: Self-pay

## 2021-09-17 IMAGING — DX DG FOOT COMPLETE 3+V*R*
3 series · 3 of 3 positions shown · non-contrast
Comparison: None.

CLINICAL DATA: Toe necrosis, foot pain

EXAM:
RIGHT FOOT COMPLETE - 3+ VIEW

[foot ap]
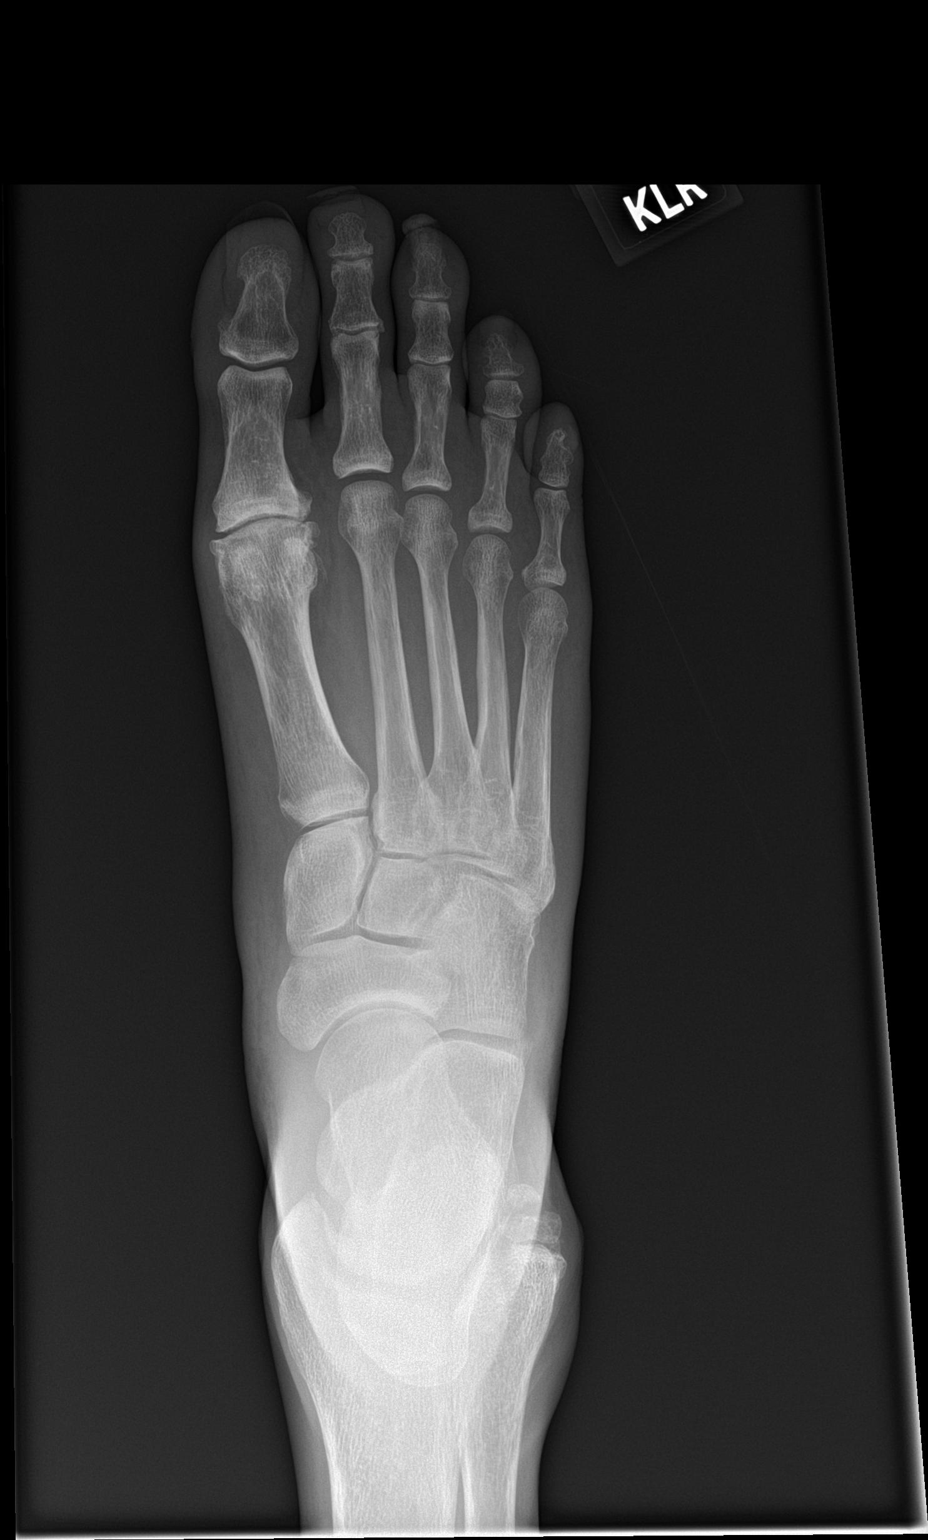

[foot obl]
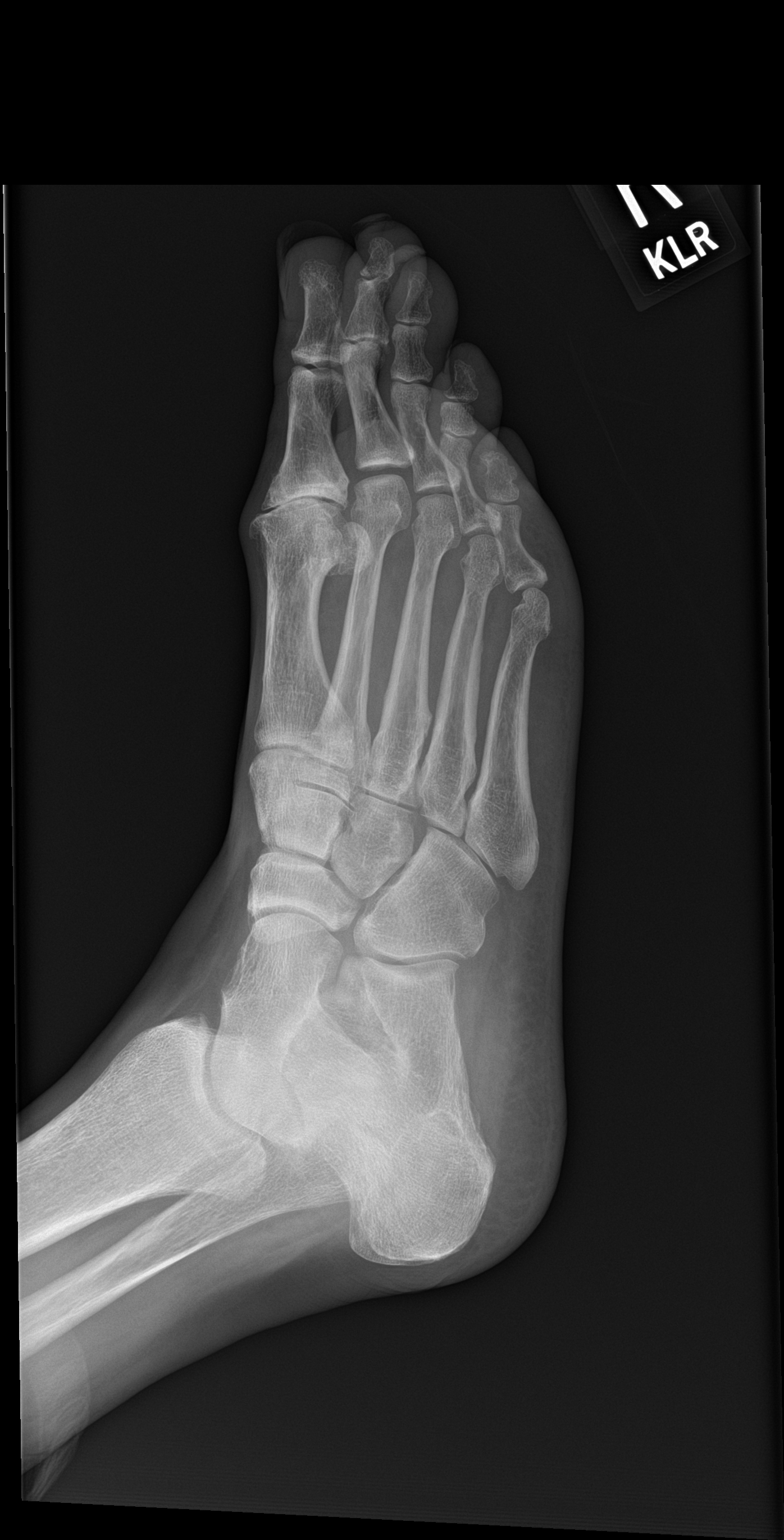

[foot lat]
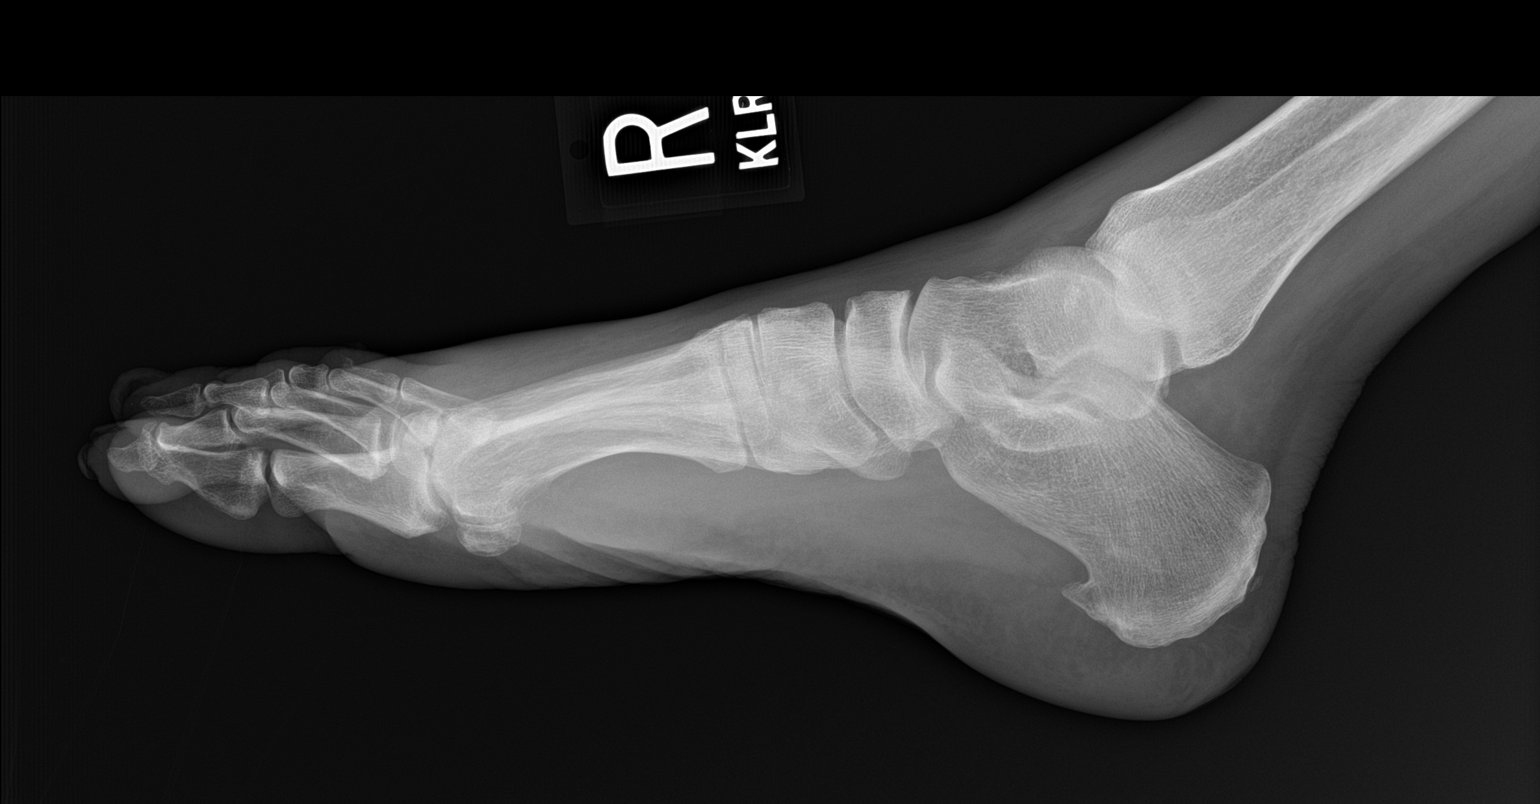

[3 of 3 positions shown; findings below may reference images not displayed]

FINDINGS: Degenerative changes of the 1st MTP joint with joint space narrowing
and spurring. No acute bony abnormality. Specifically, no fracture,
subluxation, or dislocation. Plantar calcaneal spur.
IMPRESSION: No acute bony abnormality.

## 2021-10-29 ENCOUNTER — Other Ambulatory Visit: Payer: Self-pay

## 2021-10-29 ENCOUNTER — Other Ambulatory Visit (INDEPENDENT_AMBULATORY_CARE_PROVIDER_SITE_OTHER): Payer: Self-pay | Admitting: Vascular Surgery

## 2021-11-18 ENCOUNTER — Other Ambulatory Visit: Payer: Self-pay

## 2021-11-18 MED ORDER — LEVETIRACETAM 250 MG PO TABS: ORAL_TABLET | ORAL | 0 refills | Status: AC

## 2021-11-19 ENCOUNTER — Other Ambulatory Visit: Payer: Self-pay | Admitting: Dentistry

## 2021-11-19 DIAGNOSIS — M419 Scoliosis, unspecified: Secondary | ICD-10-CM

## 2022-01-14 ENCOUNTER — Other Ambulatory Visit: Payer: Self-pay

## 2022-04-05 DEATH — deceased
